# Patient Record
Sex: Female | Born: 1950
Health system: Southern US, Community
[De-identification: ages and names within clinical notes are randomized; demographics above are authoritative.]

## PROBLEM LIST (undated history)

## (undated) DIAGNOSIS — M1990 Unspecified osteoarthritis, unspecified site: Secondary | ICD-10-CM

## (undated) DIAGNOSIS — T07XXXA Unspecified multiple injuries, initial encounter: Secondary | ICD-10-CM

## (undated) HISTORY — DX: Unspecified multiple injuries, initial encounter: T07.XXXA

## (undated) HISTORY — PX: ABDOMINAL EXPLORATION SURGERY: SHX538

## (undated) HISTORY — PX: KNEE SURGERY: SHX244

## (undated) HISTORY — PX: ABDOMINAL HYSTERECTOMY: SHX81

## (undated) HISTORY — DX: Unspecified osteoarthritis, unspecified site: M19.90

---

## 1999-05-10 ENCOUNTER — Encounter: Admission: RE | Admit: 1999-05-10 | Discharge: 1999-05-10 | Payer: Self-pay | Admitting: Obstetrics and Gynecology

## 1999-05-10 ENCOUNTER — Encounter: Payer: Self-pay | Admitting: Obstetrics and Gynecology

## 2000-09-30 ENCOUNTER — Encounter: Payer: Self-pay | Admitting: Obstetrics and Gynecology

## 2000-09-30 ENCOUNTER — Encounter: Admission: RE | Admit: 2000-09-30 | Discharge: 2000-09-30 | Payer: Self-pay | Admitting: Obstetrics and Gynecology

## 2001-11-16 ENCOUNTER — Encounter: Payer: Self-pay | Admitting: Obstetrics and Gynecology

## 2001-11-16 ENCOUNTER — Encounter: Admission: RE | Admit: 2001-11-16 | Discharge: 2001-11-16 | Payer: Self-pay | Admitting: Obstetrics and Gynecology

## 2003-01-19 ENCOUNTER — Encounter: Payer: Self-pay | Admitting: Obstetrics and Gynecology

## 2003-01-19 ENCOUNTER — Ambulatory Visit (HOSPITAL_COMMUNITY): Admission: RE | Admit: 2003-01-19 | Discharge: 2003-01-19 | Payer: Self-pay | Admitting: Obstetrics and Gynecology

## 2005-12-31 ENCOUNTER — Ambulatory Visit (HOSPITAL_COMMUNITY): Admission: RE | Admit: 2005-12-31 | Discharge: 2005-12-31 | Payer: Self-pay | Admitting: Obstetrics and Gynecology

## 2010-06-09 ENCOUNTER — Encounter: Payer: Self-pay | Admitting: Obstetrics and Gynecology

## 2014-12-11 DIAGNOSIS — K210 Gastro-esophageal reflux disease with esophagitis: Secondary | ICD-10-CM | POA: Diagnosis not present

## 2014-12-11 DIAGNOSIS — I10 Essential (primary) hypertension: Secondary | ICD-10-CM | POA: Diagnosis not present

## 2014-12-11 DIAGNOSIS — K219 Gastro-esophageal reflux disease without esophagitis: Secondary | ICD-10-CM | POA: Diagnosis not present

## 2014-12-11 DIAGNOSIS — E785 Hyperlipidemia, unspecified: Secondary | ICD-10-CM | POA: Diagnosis not present

## 2014-12-11 DIAGNOSIS — R202 Paresthesia of skin: Secondary | ICD-10-CM | POA: Diagnosis not present

## 2014-12-14 ENCOUNTER — Other Ambulatory Visit (HOSPITAL_COMMUNITY): Payer: Self-pay | Admitting: Internal Medicine

## 2014-12-14 DIAGNOSIS — Z1231 Encounter for screening mammogram for malignant neoplasm of breast: Secondary | ICD-10-CM

## 2014-12-14 DIAGNOSIS — M8580 Other specified disorders of bone density and structure, unspecified site: Secondary | ICD-10-CM

## 2014-12-20 ENCOUNTER — Ambulatory Visit (HOSPITAL_COMMUNITY)
Admission: RE | Admit: 2014-12-20 | Discharge: 2014-12-20 | Disposition: A | Discharge status: Home / Self Care | Payer: Medicare Other | Source: Ambulatory Visit | Attending: Internal Medicine | Admitting: Internal Medicine

## 2014-12-20 DIAGNOSIS — M8588 Other specified disorders of bone density and structure, other site: Secondary | ICD-10-CM | POA: Diagnosis not present

## 2014-12-20 DIAGNOSIS — M810 Age-related osteoporosis without current pathological fracture: Secondary | ICD-10-CM | POA: Diagnosis not present

## 2014-12-20 DIAGNOSIS — M8580 Other specified disorders of bone density and structure, unspecified site: Secondary | ICD-10-CM | POA: Diagnosis not present

## 2014-12-20 DIAGNOSIS — Z1231 Encounter for screening mammogram for malignant neoplasm of breast: Secondary | ICD-10-CM | POA: Insufficient documentation

## 2014-12-20 DIAGNOSIS — M8589 Other specified disorders of bone density and structure, multiple sites: Secondary | ICD-10-CM | POA: Diagnosis not present

## 2014-12-20 DIAGNOSIS — Z780 Asymptomatic menopausal state: Secondary | ICD-10-CM | POA: Diagnosis not present

## 2014-12-27 DIAGNOSIS — E785 Hyperlipidemia, unspecified: Secondary | ICD-10-CM | POA: Diagnosis not present

## 2014-12-27 DIAGNOSIS — I10 Essential (primary) hypertension: Secondary | ICD-10-CM | POA: Diagnosis not present

## 2015-04-26 DIAGNOSIS — Z13220 Encounter for screening for lipoid disorders: Secondary | ICD-10-CM | POA: Diagnosis not present

## 2015-04-26 DIAGNOSIS — Z0000 Encounter for general adult medical examination without abnormal findings: Secondary | ICD-10-CM | POA: Diagnosis not present

## 2015-04-26 DIAGNOSIS — Z131 Encounter for screening for diabetes mellitus: Secondary | ICD-10-CM | POA: Diagnosis not present

## 2015-04-26 DIAGNOSIS — R7301 Impaired fasting glucose: Secondary | ICD-10-CM | POA: Diagnosis not present

## 2015-04-26 DIAGNOSIS — E782 Mixed hyperlipidemia: Secondary | ICD-10-CM | POA: Diagnosis not present

## 2015-05-01 DIAGNOSIS — E782 Mixed hyperlipidemia: Secondary | ICD-10-CM | POA: Diagnosis not present

## 2015-05-01 DIAGNOSIS — B353 Tinea pedis: Secondary | ICD-10-CM | POA: Diagnosis not present

## 2015-05-01 DIAGNOSIS — R030 Elevated blood-pressure reading, without diagnosis of hypertension: Secondary | ICD-10-CM | POA: Diagnosis not present

## 2015-09-10 DIAGNOSIS — R7301 Impaired fasting glucose: Secondary | ICD-10-CM | POA: Diagnosis not present

## 2015-09-10 DIAGNOSIS — E782 Mixed hyperlipidemia: Secondary | ICD-10-CM | POA: Diagnosis not present

## 2015-09-10 DIAGNOSIS — Z13818 Encounter for screening for other digestive system disorders: Secondary | ICD-10-CM | POA: Diagnosis not present

## 2015-09-12 DIAGNOSIS — R1011 Right upper quadrant pain: Secondary | ICD-10-CM | POA: Diagnosis not present

## 2015-09-12 DIAGNOSIS — I10 Essential (primary) hypertension: Secondary | ICD-10-CM | POA: Diagnosis not present

## 2015-09-12 DIAGNOSIS — E782 Mixed hyperlipidemia: Secondary | ICD-10-CM | POA: Diagnosis not present

## 2015-09-14 ENCOUNTER — Other Ambulatory Visit (HOSPITAL_COMMUNITY): Payer: Self-pay | Admitting: Internal Medicine

## 2015-09-14 DIAGNOSIS — R1011 Right upper quadrant pain: Secondary | ICD-10-CM

## 2015-09-18 ENCOUNTER — Ambulatory Visit (HOSPITAL_COMMUNITY)
Admission: RE | Admit: 2015-09-18 | Discharge: 2015-09-18 | Disposition: A | Discharge status: Home / Self Care | Payer: Medicare Other | Source: Ambulatory Visit | Attending: Internal Medicine | Admitting: Internal Medicine

## 2015-09-18 DIAGNOSIS — R1011 Right upper quadrant pain: Secondary | ICD-10-CM | POA: Diagnosis not present

## 2015-09-18 DIAGNOSIS — R932 Abnormal findings on diagnostic imaging of liver and biliary tract: Secondary | ICD-10-CM | POA: Diagnosis not present

## 2015-09-18 DIAGNOSIS — R109 Unspecified abdominal pain: Secondary | ICD-10-CM | POA: Diagnosis not present

## 2015-09-28 DIAGNOSIS — M79671 Pain in right foot: Secondary | ICD-10-CM | POA: Diagnosis not present

## 2015-11-16 DIAGNOSIS — J060 Acute laryngopharyngitis: Secondary | ICD-10-CM | POA: Diagnosis not present

## 2015-11-16 DIAGNOSIS — R05 Cough: Secondary | ICD-10-CM | POA: Diagnosis not present

## 2016-02-12 DIAGNOSIS — M79674 Pain in right toe(s): Secondary | ICD-10-CM | POA: Diagnosis not present

## 2016-02-12 DIAGNOSIS — M79671 Pain in right foot: Secondary | ICD-10-CM | POA: Diagnosis not present

## 2016-02-12 DIAGNOSIS — L600 Ingrowing nail: Secondary | ICD-10-CM | POA: Diagnosis not present

## 2016-02-12 DIAGNOSIS — L03031 Cellulitis of right toe: Secondary | ICD-10-CM | POA: Diagnosis not present

## 2016-02-14 DIAGNOSIS — E782 Mixed hyperlipidemia: Secondary | ICD-10-CM | POA: Diagnosis not present

## 2016-02-14 DIAGNOSIS — I10 Essential (primary) hypertension: Secondary | ICD-10-CM | POA: Diagnosis not present

## 2016-02-18 DIAGNOSIS — Z0000 Encounter for general adult medical examination without abnormal findings: Secondary | ICD-10-CM | POA: Diagnosis not present

## 2016-02-18 DIAGNOSIS — I10 Essential (primary) hypertension: Secondary | ICD-10-CM | POA: Diagnosis not present

## 2016-02-18 DIAGNOSIS — R05 Cough: Secondary | ICD-10-CM | POA: Diagnosis not present

## 2016-02-18 DIAGNOSIS — E782 Mixed hyperlipidemia: Secondary | ICD-10-CM | POA: Diagnosis not present

## 2016-02-19 ENCOUNTER — Other Ambulatory Visit (HOSPITAL_COMMUNITY): Payer: Self-pay | Admitting: Adult Health Nurse Practitioner

## 2016-02-19 ENCOUNTER — Ambulatory Visit (HOSPITAL_COMMUNITY)
Admission: RE | Admit: 2016-02-19 | Discharge: 2016-02-19 | Disposition: A | Discharge status: Home / Self Care | Payer: Medicare Other | Source: Ambulatory Visit | Attending: Adult Health Nurse Practitioner | Admitting: Adult Health Nurse Practitioner

## 2016-02-19 DIAGNOSIS — R05 Cough: Secondary | ICD-10-CM | POA: Diagnosis not present

## 2016-02-19 DIAGNOSIS — R059 Cough, unspecified: Secondary | ICD-10-CM

## 2016-02-19 DIAGNOSIS — Z87891 Personal history of nicotine dependence: Secondary | ICD-10-CM | POA: Diagnosis not present

## 2016-02-19 DIAGNOSIS — Z1231 Encounter for screening mammogram for malignant neoplasm of breast: Secondary | ICD-10-CM

## 2016-02-27 ENCOUNTER — Ambulatory Visit (HOSPITAL_COMMUNITY)
Admission: RE | Admit: 2016-02-27 | Discharge: 2016-02-27 | Disposition: A | Discharge status: Home / Self Care | Payer: Medicare Other | Source: Ambulatory Visit | Attending: Adult Health Nurse Practitioner | Admitting: Adult Health Nurse Practitioner

## 2016-02-27 DIAGNOSIS — Z1231 Encounter for screening mammogram for malignant neoplasm of breast: Secondary | ICD-10-CM

## 2016-03-04 DIAGNOSIS — L03031 Cellulitis of right toe: Secondary | ICD-10-CM | POA: Diagnosis not present

## 2016-03-04 DIAGNOSIS — L600 Ingrowing nail: Secondary | ICD-10-CM | POA: Diagnosis not present

## 2016-03-04 DIAGNOSIS — M79671 Pain in right foot: Secondary | ICD-10-CM | POA: Diagnosis not present

## 2016-03-04 DIAGNOSIS — M79674 Pain in right toe(s): Secondary | ICD-10-CM | POA: Diagnosis not present

## 2016-07-28 ENCOUNTER — Ambulatory Visit (HOSPITAL_COMMUNITY)
Admission: RE | Admit: 2016-07-28 | Discharge: 2016-07-28 | Disposition: A | Discharge status: Home / Self Care | Payer: Medicare Other | Source: Ambulatory Visit | Attending: Internal Medicine | Admitting: Internal Medicine

## 2016-07-28 ENCOUNTER — Other Ambulatory Visit (HOSPITAL_COMMUNITY): Payer: Self-pay | Admitting: Internal Medicine

## 2016-07-28 DIAGNOSIS — S52692A Other fracture of lower end of left ulna, initial encounter for closed fracture: Secondary | ICD-10-CM | POA: Diagnosis not present

## 2016-07-28 DIAGNOSIS — X58XXXA Exposure to other specified factors, initial encounter: Secondary | ICD-10-CM | POA: Diagnosis not present

## 2016-07-28 DIAGNOSIS — M25522 Pain in left elbow: Secondary | ICD-10-CM | POA: Diagnosis not present

## 2016-07-28 DIAGNOSIS — M25539 Pain in unspecified wrist: Secondary | ICD-10-CM | POA: Insufficient documentation

## 2016-07-28 DIAGNOSIS — S59902A Unspecified injury of left elbow, initial encounter: Secondary | ICD-10-CM | POA: Diagnosis not present

## 2016-07-28 DIAGNOSIS — W19XXXA Unspecified fall, initial encounter: Secondary | ICD-10-CM

## 2016-07-28 DIAGNOSIS — S52602A Unspecified fracture of lower end of left ulna, initial encounter for closed fracture: Secondary | ICD-10-CM | POA: Diagnosis not present

## 2016-07-29 ENCOUNTER — Ambulatory Visit (INDEPENDENT_AMBULATORY_CARE_PROVIDER_SITE_OTHER): Payer: Medicare Other | Admitting: Orthopedic Surgery

## 2016-07-29 ENCOUNTER — Encounter: Payer: Self-pay | Admitting: Orthopedic Surgery

## 2016-07-29 VITALS — BP 175/96 | HR 86 | Temp 97.7°F | Ht 64.5 in | Wt 126.0 lb

## 2016-07-29 DIAGNOSIS — S52692A Other fracture of lower end of left ulna, initial encounter for closed fracture: Secondary | ICD-10-CM

## 2016-07-29 NOTE — Progress Notes (Signed)
Patient ID: Sydney Mcdowell, female   DOB: [birth year], 66 y.o.   MRN: [id number]  Chief Complaint  Patient presents with  . Wrist Pain    [place] 07/28/16.  Fracture of distal ulnar.    HPI Sydney Mcdowell is a 66 y.o. female.   Presents for evaluation of a left wrist fracture. Referring physician Dr. Nevada Crane  The patient fell on March 10 presented to the primary care physician and x-rays showed a distal ulnar fracture nondisplaced he referred her over today  She complains of pain in her left arm including her left elbow shoulder primarily left wrist with swelling decreased range of motion and dull throbbing pain  She brought herself a splint which is not working too good at this time   Review of Systems Review of Systems  Skin: Positive for color change.  Neurological: Negative for numbness.     Past Medical History:  Diagnosis Date  . Arthritis   . Fractures     Past Surgical History:  Procedure Laterality Date  . ABDOMINAL EXPLORATION SURGERY    . ABDOMINAL HYSTERECTOMY    . KNEE SURGERY        Physical Exam 1 Blood pressure (!) 175/96, pulse 86, temperature 97.7 F (36.5 C), height 5' 4.5" (1.638 m), weight 126 lb (57.2 kg). Physical Exam 2 The patient is well developed well nourished and well groomed. 3 Orientation to person place and time is normal  4 Mood is pleasant.  5 Ambulatory status No gait disturbances 6 Inspection of the left wrist reveals moderate tenderness   mild swelling over the left distal ulnar no deformity 7 Range of motion assessment: The range of motion is diminished primarily secondary to pain 8 Stability tests are deferred because of pain but the x-ray shows no subluxation of the joint 9 Strength assessment muscle tone is normal resistance testing is deferred because of pain and swelling  10 Nerve function normal radial ulnar median nerve sensation 11 Vascular function pulse and perfusion normal radial ulnar artery 12 Local lymphatic system  normal in the epitrochlear region  She has mild tenderness over the elbow, x-ray was negative  Mild tenderness shoulder normal range of motion  Opposite extremity right upper extremity there is no alignment abnormality, no contracture, no subluxation, no atrophy and neurovascular exam is intact  Data Reviewed X-rays I've independently interpreted the x-ray as follows  3 views of the left wrist: And left elbow nondisplaced oblique distal ulnar fracture with normal radial head normal elbow  Assessment    Distal ulna fracture   Long splint removable    Plan    X-rays 6 weeks

## 2016-08-18 DIAGNOSIS — I10 Essential (primary) hypertension: Secondary | ICD-10-CM | POA: Diagnosis not present

## 2016-08-18 DIAGNOSIS — E782 Mixed hyperlipidemia: Secondary | ICD-10-CM | POA: Diagnosis not present

## 2016-08-20 DIAGNOSIS — I10 Essential (primary) hypertension: Secondary | ICD-10-CM | POA: Diagnosis not present

## 2016-08-20 DIAGNOSIS — S6292XD Unspecified fracture of left wrist and hand, subsequent encounter for fracture with routine healing: Secondary | ICD-10-CM | POA: Diagnosis not present

## 2016-08-20 DIAGNOSIS — E782 Mixed hyperlipidemia: Secondary | ICD-10-CM | POA: Diagnosis not present

## 2016-08-20 DIAGNOSIS — Z6821 Body mass index (BMI) 21.0-21.9, adult: Secondary | ICD-10-CM | POA: Diagnosis not present

## 2016-09-08 ENCOUNTER — Encounter: Payer: Self-pay | Admitting: Orthopedic Surgery

## 2016-09-08 ENCOUNTER — Ambulatory Visit (INDEPENDENT_AMBULATORY_CARE_PROVIDER_SITE_OTHER): Payer: Self-pay | Admitting: Orthopedic Surgery

## 2016-09-08 ENCOUNTER — Ambulatory Visit (INDEPENDENT_AMBULATORY_CARE_PROVIDER_SITE_OTHER): Payer: Medicare Other

## 2016-09-08 DIAGNOSIS — S52692G Other fracture of lower end of left ulna, subsequent encounter for closed fracture with delayed healing: Secondary | ICD-10-CM | POA: Diagnosis not present

## 2016-09-08 NOTE — Progress Notes (Signed)
Follow-up fracture distal ulna  X-rays out of plaster today  X-rays show the fracture is healing with no malalignment. Minimal callus formation  She is still tender at the fracture site. She does complain of some pain at the elbow but there is no subluxation dislocation in range of motion remains normal  Recommend x-ray in 6 weeks and continue immobilization until then

## 2016-10-20 ENCOUNTER — Ambulatory Visit (INDEPENDENT_AMBULATORY_CARE_PROVIDER_SITE_OTHER): Payer: Medicare Other

## 2016-10-20 ENCOUNTER — Ambulatory Visit (INDEPENDENT_AMBULATORY_CARE_PROVIDER_SITE_OTHER): Payer: Self-pay | Admitting: Orthopedic Surgery

## 2016-10-20 ENCOUNTER — Encounter: Payer: Self-pay | Admitting: Orthopedic Surgery

## 2016-10-20 DIAGNOSIS — S52692G Other fracture of lower end of left ulna, subsequent encounter for closed fracture with delayed healing: Secondary | ICD-10-CM | POA: Diagnosis not present

## 2016-10-20 MED ORDER — IBUPROFEN 800 MG PO TABS: 800.0000 mg | ORAL_TABLET | Freq: Three times a day (TID) | ORAL | 1 refills | Status: DC | PRN

## 2016-10-20 NOTE — Progress Notes (Signed)
Fracture care follow-up  Chief Complaint  Patient presents with  . Follow-up    xray Lt wrist fx, DOI 07/28/16    Post injury day number  69, week # 10  Fracture treated with  cast followed by removable brace  X-rays today show healing with normal alignment in the left ulna   Current Outpatient Prescriptions:  .  aspirin 325 MG tablet, Take 325 mg by mouth 2 (two) times daily., Disp: , Rfl:  .  Coenzyme Q10 (COQ-10) 10 MG CAPS, Take by mouth., Disp: , Rfl:  .  SIMVASTATIN PO, Take by mouth., Disp: , Rfl:    Clinical exam  overall alignment looks good between the right and the left forearm and wrist. She does have some tenderness near the fracture site  Encounter Diagnosis  Name Primary?  . Other closed fracture of distal end of left ulna with delayed healing, subsequent encounter Yes    Plan Recommend self weaning process from the forearm splint and the ibuprofen is okay  Follow-up as needed  Meds ordered this encounter  Medications  . ibuprofen (ADVIL,MOTRIN) 800 MG tablet    Sig: Take 1 tablet (800 mg total) by mouth every 8 (eight) hours as needed.    Dispense:  90 tablet    Refill:  1

## 2016-10-20 NOTE — Patient Instructions (Signed)
Use the brace as needed

## 2017-02-05 ENCOUNTER — Other Ambulatory Visit: Payer: Self-pay

## 2017-02-05 NOTE — Patient Outreach (Signed)
[facility]) Care Management  02/05/2017  Sydney Mcdowell 06/03/1950 [id number]   Medication Adherence call to [name] the reason for this call is because [name] is showing past due under [place] health Care Ins.on Simvastatin 20 mg she said she is going to get it from [employer] today she just notice she did not have any medication I ask if she had a pill box she said she likes to use the bottle instead of a pill box, offer to order it from [place] she refuse she said she was going to order it her self.    [place] Management Direct Dial [phone]  Fax [phone] [email or web address]

## 2017-02-18 ENCOUNTER — Other Ambulatory Visit (HOSPITAL_COMMUNITY): Payer: Self-pay | Admitting: Internal Medicine

## 2017-02-18 DIAGNOSIS — Z1231 Encounter for screening mammogram for malignant neoplasm of breast: Secondary | ICD-10-CM

## 2017-02-20 DIAGNOSIS — I10 Essential (primary) hypertension: Secondary | ICD-10-CM | POA: Diagnosis not present

## 2017-02-24 DIAGNOSIS — I10 Essential (primary) hypertension: Secondary | ICD-10-CM | POA: Diagnosis not present

## 2017-02-24 DIAGNOSIS — Z0000 Encounter for general adult medical examination without abnormal findings: Secondary | ICD-10-CM | POA: Diagnosis not present

## 2017-02-24 DIAGNOSIS — Z6820 Body mass index (BMI) 20.0-20.9, adult: Secondary | ICD-10-CM | POA: Diagnosis not present

## 2017-02-24 DIAGNOSIS — E782 Mixed hyperlipidemia: Secondary | ICD-10-CM | POA: Diagnosis not present

## 2017-04-30 DIAGNOSIS — J029 Acute pharyngitis, unspecified: Secondary | ICD-10-CM | POA: Diagnosis not present

## 2017-04-30 DIAGNOSIS — R05 Cough: Secondary | ICD-10-CM | POA: Diagnosis not present

## 2017-04-30 DIAGNOSIS — H6501 Acute serous otitis media, right ear: Secondary | ICD-10-CM | POA: Diagnosis not present

## 2017-05-15 DIAGNOSIS — H6501 Acute serous otitis media, right ear: Secondary | ICD-10-CM | POA: Diagnosis not present

## 2017-05-15 DIAGNOSIS — R05 Cough: Secondary | ICD-10-CM | POA: Diagnosis not present

## 2017-05-15 DIAGNOSIS — J019 Acute sinusitis, unspecified: Secondary | ICD-10-CM | POA: Diagnosis not present

## 2017-07-27 DIAGNOSIS — H524 Presbyopia: Secondary | ICD-10-CM | POA: Diagnosis not present

## 2017-07-27 DIAGNOSIS — H353132 Nonexudative age-related macular degeneration, bilateral, intermediate dry stage: Secondary | ICD-10-CM | POA: Diagnosis not present

## 2017-08-19 DIAGNOSIS — H6501 Acute serous otitis media, right ear: Secondary | ICD-10-CM | POA: Diagnosis not present

## 2017-08-19 DIAGNOSIS — I10 Essential (primary) hypertension: Secondary | ICD-10-CM | POA: Diagnosis not present

## 2017-08-19 DIAGNOSIS — Z8781 Personal history of (healed) traumatic fracture: Secondary | ICD-10-CM | POA: Diagnosis not present

## 2017-08-19 DIAGNOSIS — Z7689 Persons encountering health services in other specified circumstances: Secondary | ICD-10-CM | POA: Diagnosis not present

## 2017-08-19 DIAGNOSIS — J029 Acute pharyngitis, unspecified: Secondary | ICD-10-CM | POA: Diagnosis not present

## 2017-08-19 DIAGNOSIS — J019 Acute sinusitis, unspecified: Secondary | ICD-10-CM | POA: Diagnosis not present

## 2017-08-19 DIAGNOSIS — R05 Cough: Secondary | ICD-10-CM | POA: Diagnosis not present

## 2017-08-25 DIAGNOSIS — E782 Mixed hyperlipidemia: Secondary | ICD-10-CM | POA: Diagnosis not present

## 2017-08-25 DIAGNOSIS — I10 Essential (primary) hypertension: Secondary | ICD-10-CM | POA: Diagnosis not present

## 2017-08-25 DIAGNOSIS — J019 Acute sinusitis, unspecified: Secondary | ICD-10-CM | POA: Diagnosis not present

## 2017-09-25 DIAGNOSIS — H9209 Otalgia, unspecified ear: Secondary | ICD-10-CM | POA: Diagnosis not present

## 2017-09-25 DIAGNOSIS — I10 Essential (primary) hypertension: Secondary | ICD-10-CM | POA: Diagnosis not present

## 2017-11-10 ENCOUNTER — Other Ambulatory Visit: Payer: Self-pay | Admitting: Orthopedic Surgery

## 2017-11-10 DIAGNOSIS — S52692G Other fracture of lower end of left ulna, subsequent encounter for closed fracture with delayed healing: Secondary | ICD-10-CM

## 2018-02-25 DIAGNOSIS — I10 Essential (primary) hypertension: Secondary | ICD-10-CM | POA: Diagnosis not present

## 2018-02-25 DIAGNOSIS — E559 Vitamin D deficiency, unspecified: Secondary | ICD-10-CM | POA: Diagnosis not present

## 2018-02-25 DIAGNOSIS — E782 Mixed hyperlipidemia: Secondary | ICD-10-CM | POA: Diagnosis not present

## 2018-03-30 DIAGNOSIS — Z0000 Encounter for general adult medical examination without abnormal findings: Secondary | ICD-10-CM | POA: Diagnosis not present

## 2018-03-30 DIAGNOSIS — E559 Vitamin D deficiency, unspecified: Secondary | ICD-10-CM | POA: Diagnosis not present

## 2018-03-30 DIAGNOSIS — I10 Essential (primary) hypertension: Secondary | ICD-10-CM | POA: Diagnosis not present

## 2018-03-30 DIAGNOSIS — E782 Mixed hyperlipidemia: Secondary | ICD-10-CM | POA: Diagnosis not present

## 2018-04-06 ENCOUNTER — Other Ambulatory Visit (HOSPITAL_COMMUNITY): Payer: Self-pay | Admitting: Internal Medicine

## 2018-04-06 DIAGNOSIS — Z1231 Encounter for screening mammogram for malignant neoplasm of breast: Secondary | ICD-10-CM

## 2018-04-22 ENCOUNTER — Encounter (HOSPITAL_COMMUNITY): Payer: Self-pay

## 2018-04-22 ENCOUNTER — Ambulatory Visit (HOSPITAL_COMMUNITY)
Admission: RE | Admit: 2018-04-22 | Discharge: 2018-04-22 | Disposition: A | Discharge status: Home / Self Care | Payer: Medicare Other | Source: Ambulatory Visit | Attending: Internal Medicine | Admitting: Internal Medicine

## 2018-04-22 DIAGNOSIS — Z1231 Encounter for screening mammogram for malignant neoplasm of breast: Secondary | ICD-10-CM | POA: Insufficient documentation

## 2018-05-02 DIAGNOSIS — Z1212 Encounter for screening for malignant neoplasm of rectum: Secondary | ICD-10-CM | POA: Diagnosis not present

## 2018-05-02 DIAGNOSIS — Z1211 Encounter for screening for malignant neoplasm of colon: Secondary | ICD-10-CM | POA: Diagnosis not present

## 2018-07-13 IMAGING — DX DG ELBOW COMPLETE 3+V*L*
4 series · 4 of 4 positions shown · non-contrast
Comparison: None.

CLINICAL DATA: Pain following fall

EXAM:
LEFT ELBOW - COMPLETE 3+ VIEW

[elbow ap]
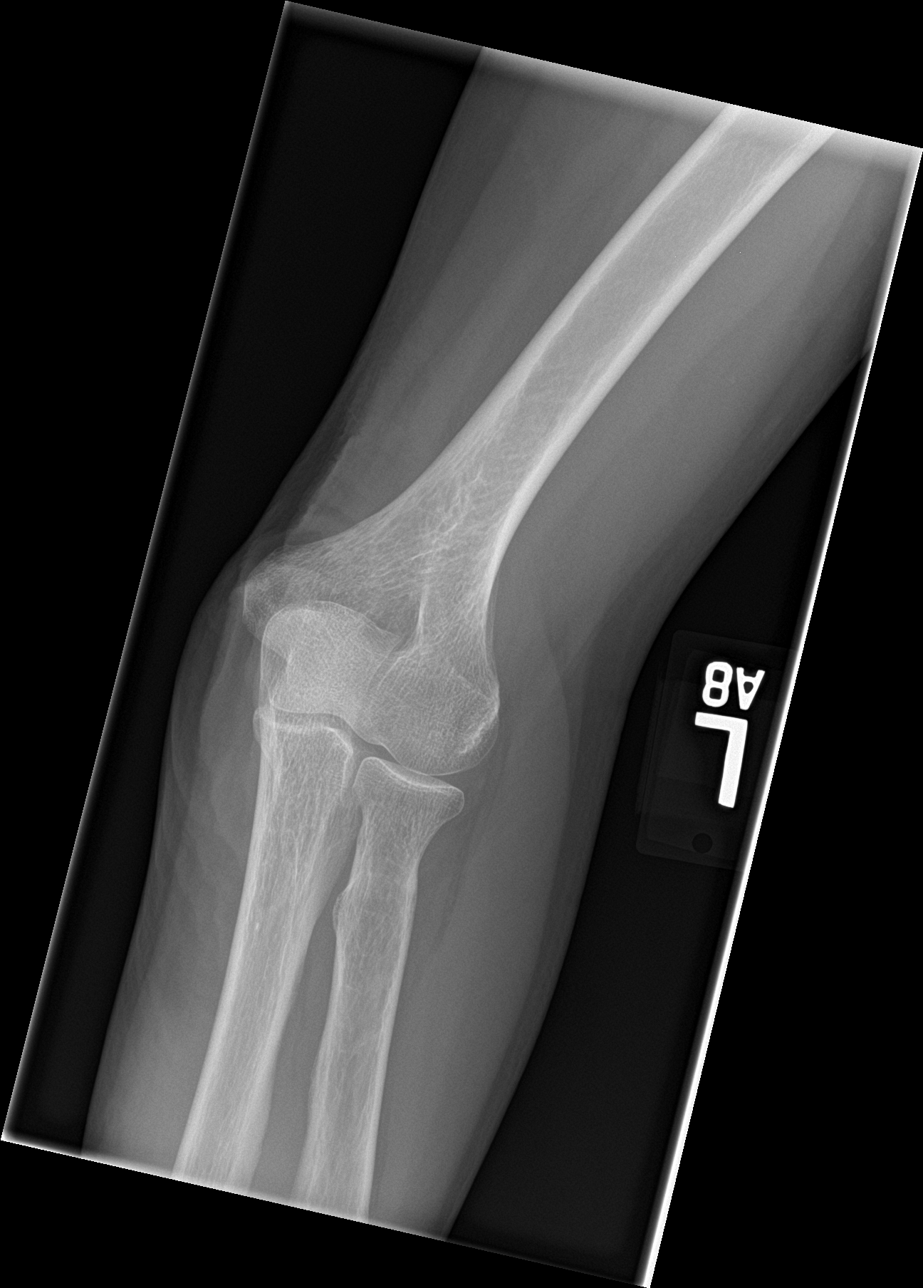

[elbow obl]
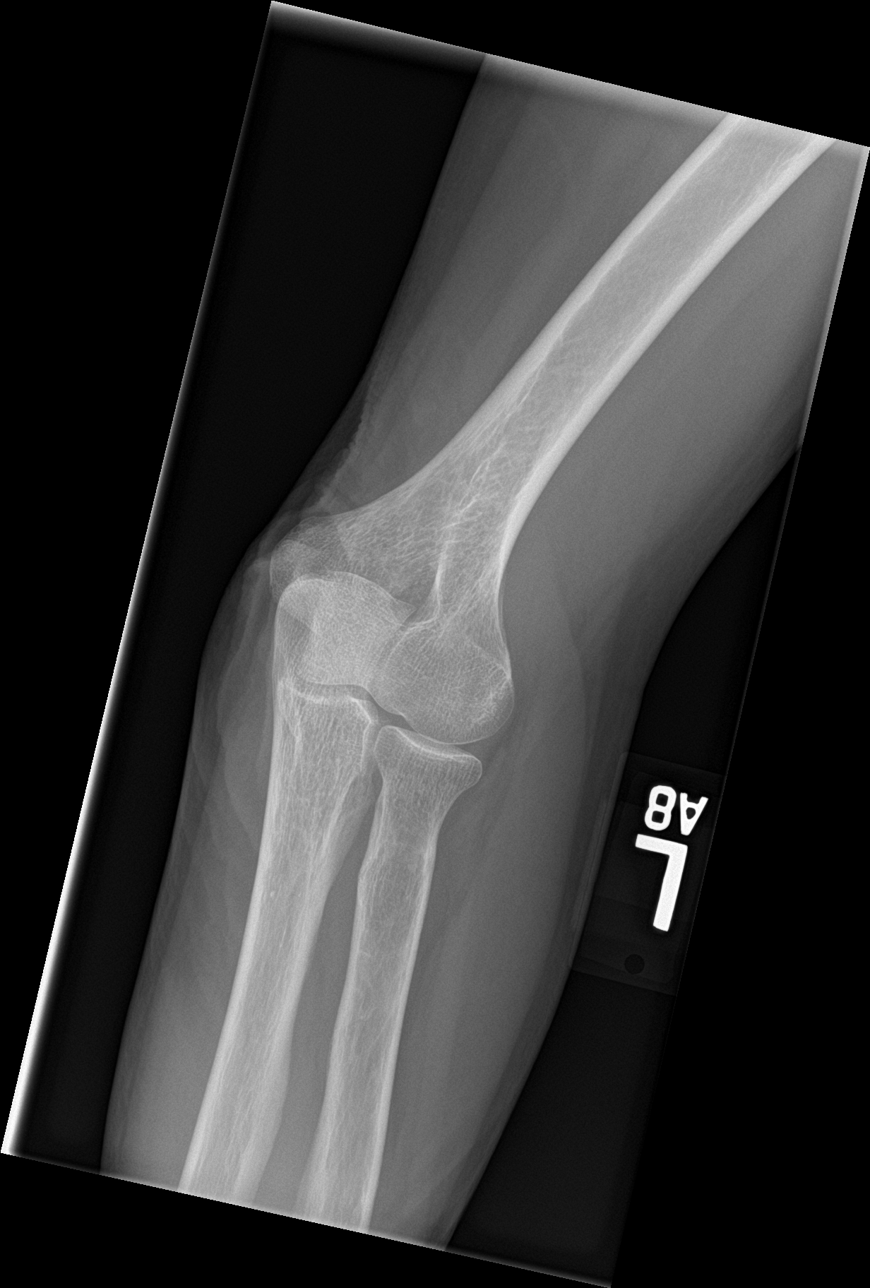

[elbow lat (1 of 2)]
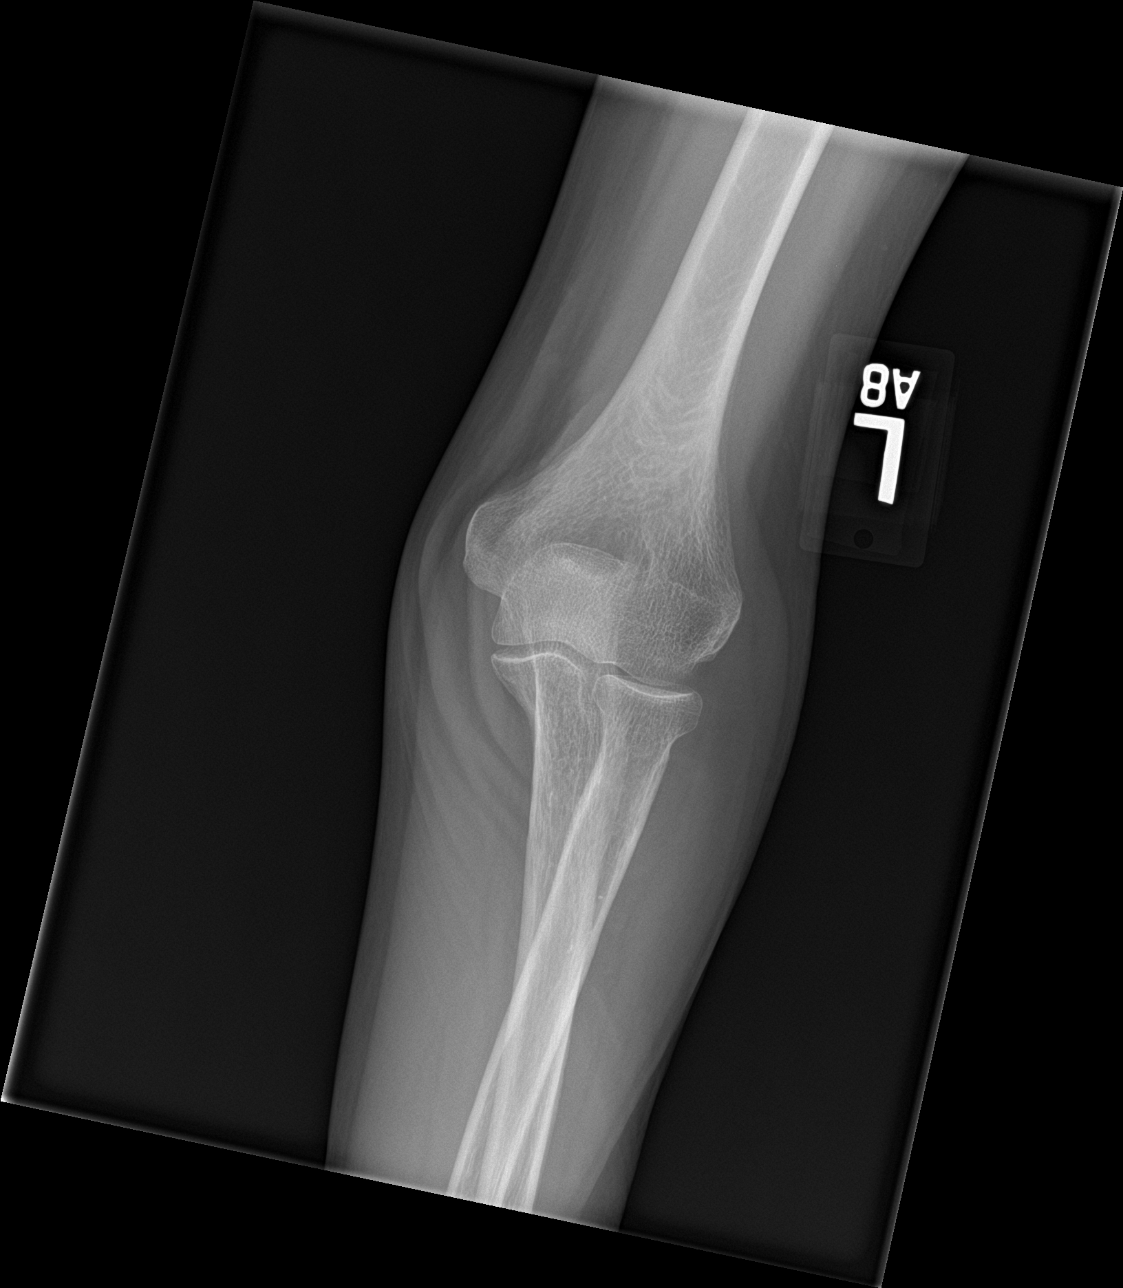

[elbow lat (2 of 2)]
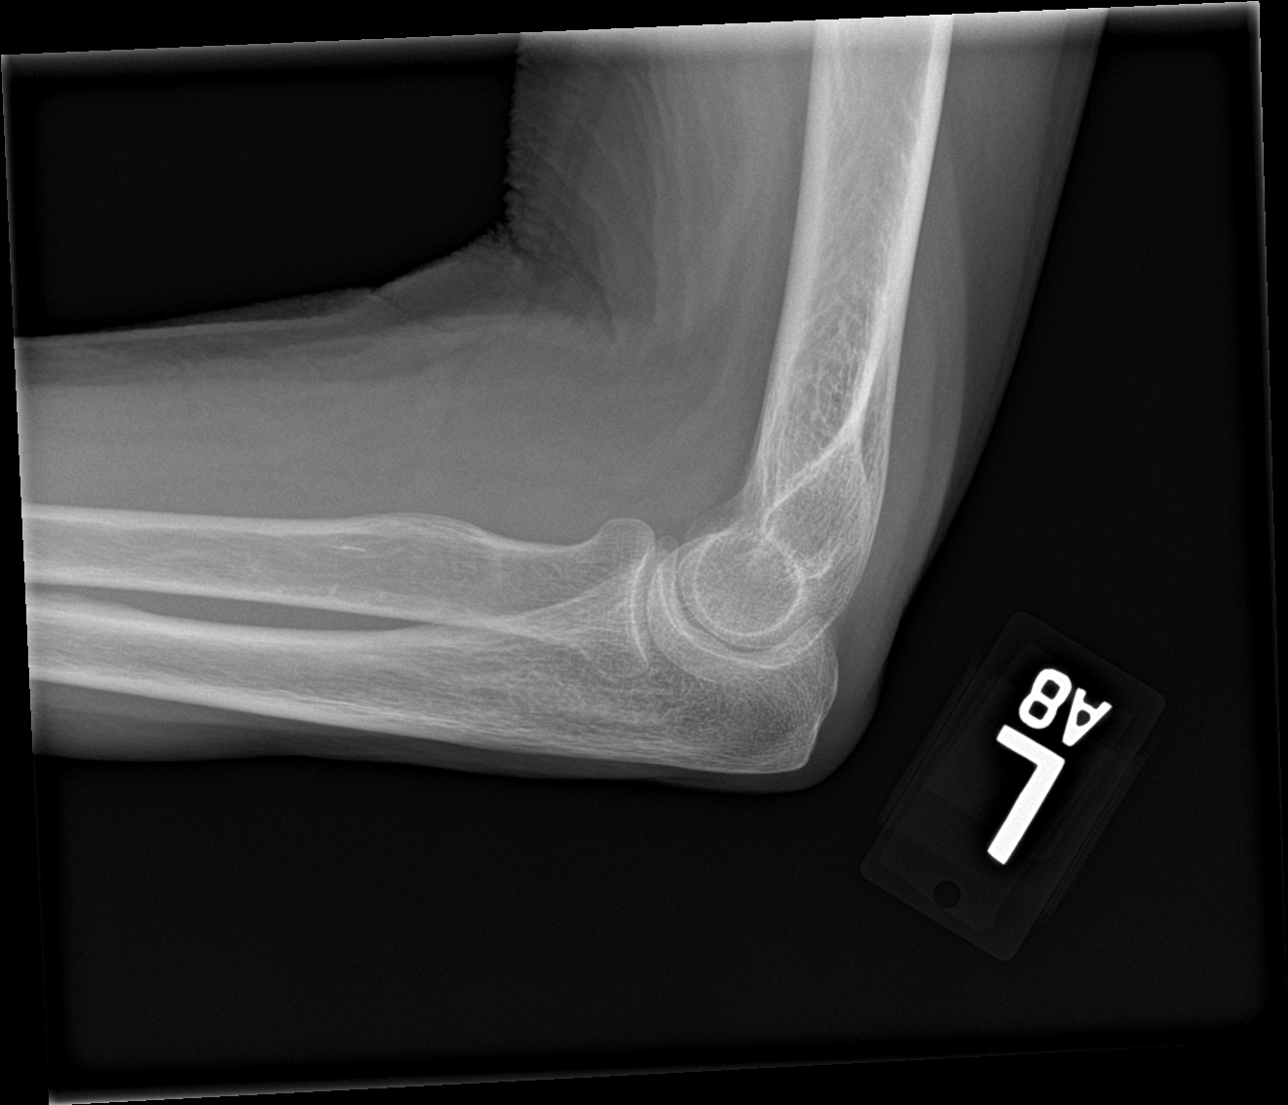

[4 of 4 positions shown; findings below may reference images not displayed]

FINDINGS: Frontal, lateral, and bilateral oblique views were obtained. There
is no evident fracture or dislocation. No joint effusion. The joint
spaces appear normal. No erosive change.
IMPRESSION: No fracture or dislocation.  No evident arthropathic change.

## 2018-07-13 IMAGING — DX DG WRIST COMPLETE 3+V*L*
4 series · 4 of 4 positions shown · non-contrast
Comparison: None.

CLINICAL DATA: Pain following fall

EXAM:
LEFT WRIST - COMPLETE 3+ VIEW

[wrist pa]
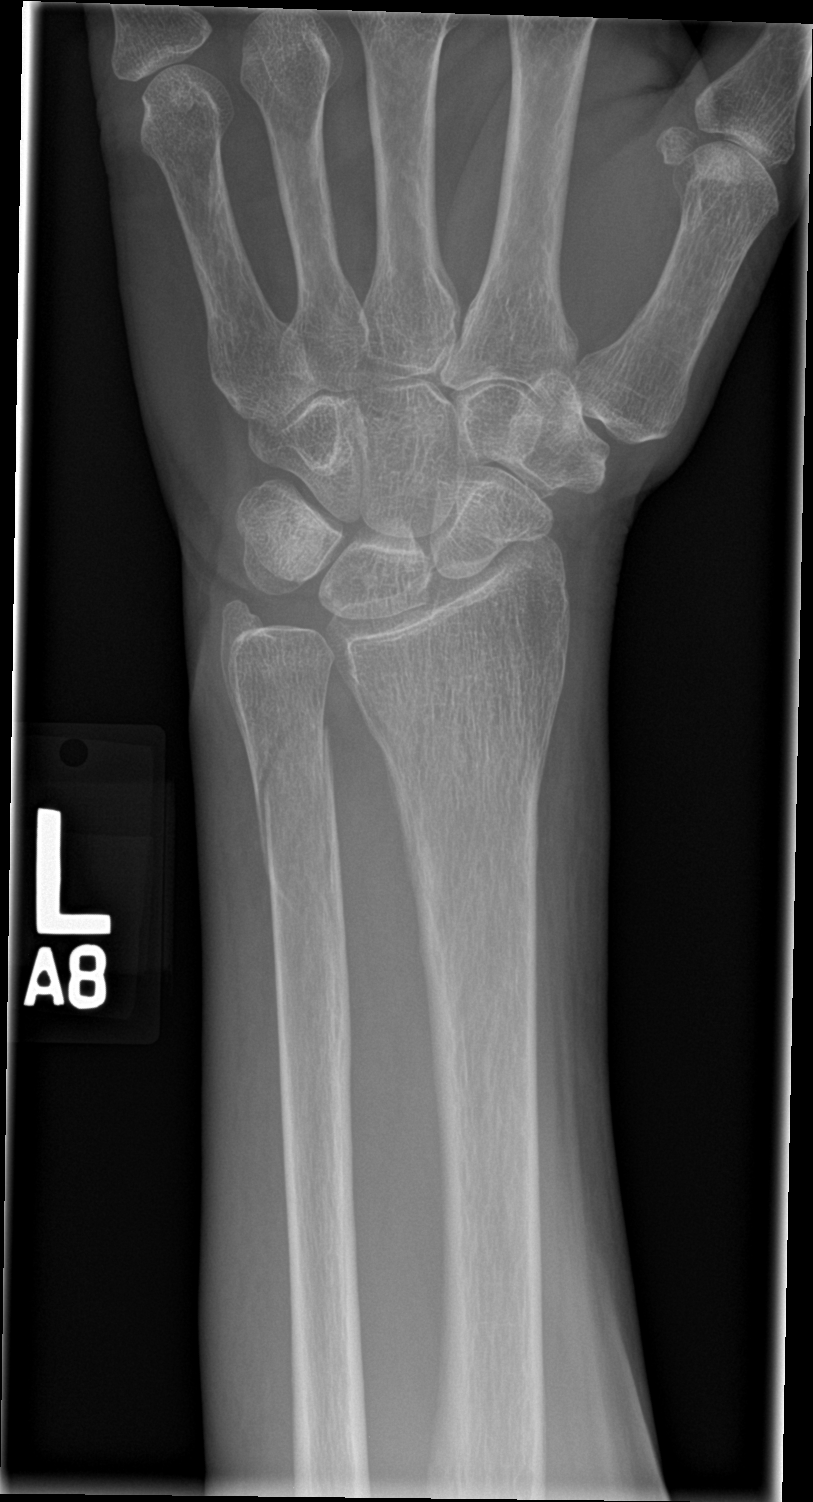

[wrist obl]
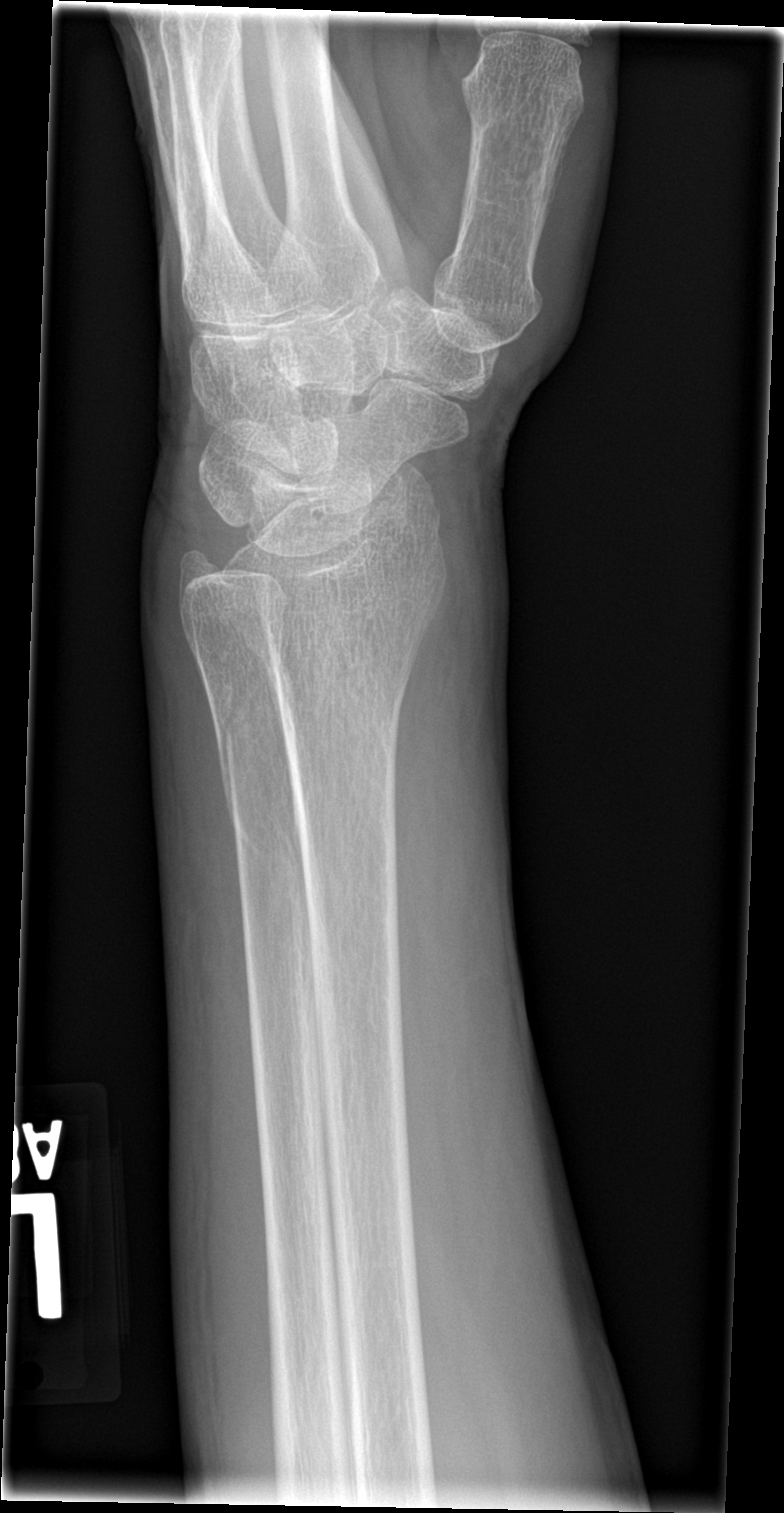

[wrist lat]
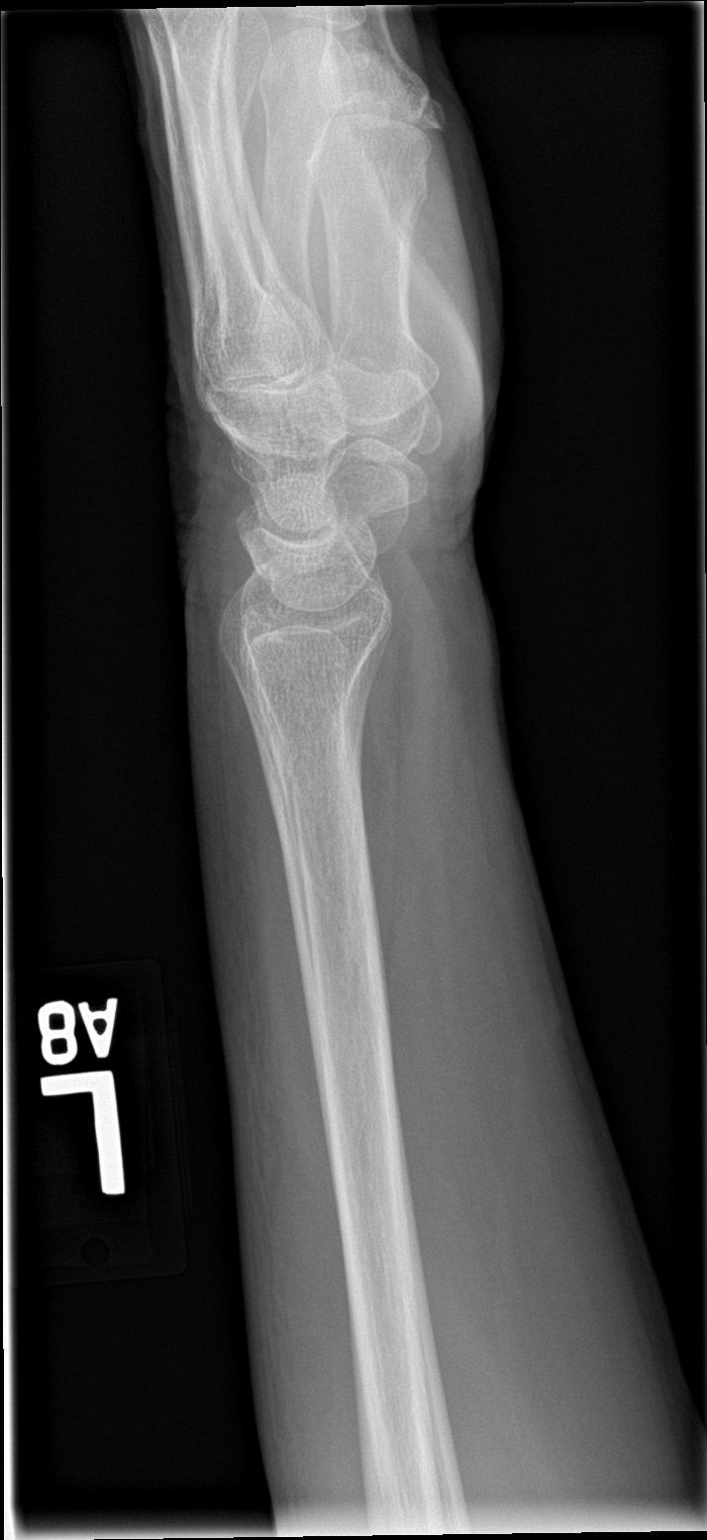

[wrist navicular]
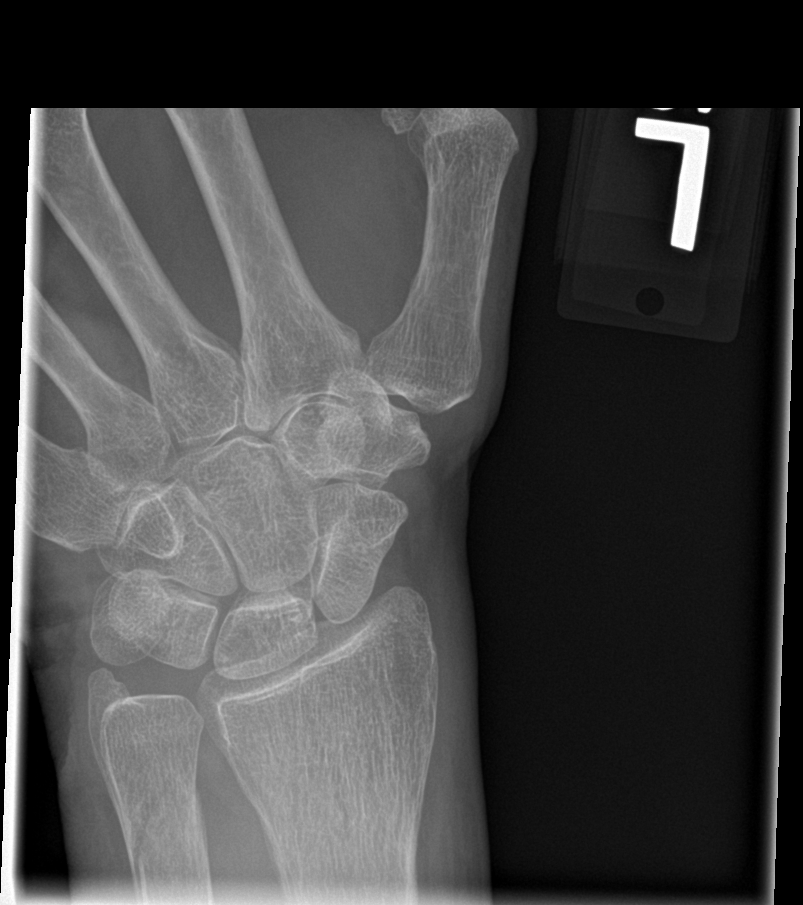

[4 of 4 positions shown; findings below may reference images not displayed]

FINDINGS: Frontal, oblique, lateral, and ulnar deviation scaphoid images were
obtained. There is an obliquely oriented fracture of the distal
ulnar diaphysis with alignment near anatomic in this area. No other
fracture is evident. No dislocation. Joint spaces appear
unremarkable.
IMPRESSION: Fracture distal ulna with alignment near anatomic.

These results will be called to the ordering clinician or
representative by the Radiologist Assistant, and communication
documented in the PACS or zVision Dashboard.

## 2018-09-16 DIAGNOSIS — E782 Mixed hyperlipidemia: Secondary | ICD-10-CM | POA: Diagnosis not present

## 2018-09-16 DIAGNOSIS — E559 Vitamin D deficiency, unspecified: Secondary | ICD-10-CM | POA: Diagnosis not present

## 2018-09-16 DIAGNOSIS — I10 Essential (primary) hypertension: Secondary | ICD-10-CM | POA: Diagnosis not present

## 2018-09-21 DIAGNOSIS — E785 Hyperlipidemia, unspecified: Secondary | ICD-10-CM | POA: Diagnosis not present

## 2018-09-21 DIAGNOSIS — E559 Vitamin D deficiency, unspecified: Secondary | ICD-10-CM | POA: Diagnosis not present

## 2018-09-21 DIAGNOSIS — I10 Essential (primary) hypertension: Secondary | ICD-10-CM | POA: Diagnosis not present

## 2018-09-23 DIAGNOSIS — Z0000 Encounter for general adult medical examination without abnormal findings: Secondary | ICD-10-CM | POA: Diagnosis not present

## 2019-03-29 DIAGNOSIS — E782 Mixed hyperlipidemia: Secondary | ICD-10-CM | POA: Diagnosis not present

## 2019-03-29 DIAGNOSIS — E785 Hyperlipidemia, unspecified: Secondary | ICD-10-CM | POA: Diagnosis not present

## 2019-03-29 DIAGNOSIS — I10 Essential (primary) hypertension: Secondary | ICD-10-CM | POA: Diagnosis not present

## 2019-03-29 DIAGNOSIS — E559 Vitamin D deficiency, unspecified: Secondary | ICD-10-CM | POA: Diagnosis not present

## 2019-04-01 DIAGNOSIS — M5481 Occipital neuralgia: Secondary | ICD-10-CM | POA: Diagnosis not present

## 2019-04-01 DIAGNOSIS — E559 Vitamin D deficiency, unspecified: Secondary | ICD-10-CM | POA: Diagnosis not present

## 2019-04-01 DIAGNOSIS — Z23 Encounter for immunization: Secondary | ICD-10-CM | POA: Diagnosis not present

## 2019-04-01 DIAGNOSIS — I10 Essential (primary) hypertension: Secondary | ICD-10-CM | POA: Diagnosis not present

## 2019-04-01 DIAGNOSIS — E782 Mixed hyperlipidemia: Secondary | ICD-10-CM | POA: Diagnosis not present

## 2019-04-25 ENCOUNTER — Other Ambulatory Visit: Payer: Self-pay

## 2019-04-25 NOTE — Patient Outreach (Signed)
[facility]) Care Management  04/25/2019  Sydney Mcdowell 01-04-1951 [id number]   Medication Adherence call to Mrs. Sydney Mcdowell [facility] Identifiers Verify spoke with patient she is past due on Lisinopril 10 mg she takes 1 tablet daily,patient explain she has medication at this time and will order when due,patient explain someone from [facility] had already call and had call doctors office to make sure patient had enough medication. [name] is showing past due under [place].   [place] Management Direct Dial [phone]  Fax [phone] [email or web address]

## 2019-05-02 DIAGNOSIS — E785 Hyperlipidemia, unspecified: Secondary | ICD-10-CM | POA: Diagnosis not present

## 2019-05-02 DIAGNOSIS — I10 Essential (primary) hypertension: Secondary | ICD-10-CM | POA: Diagnosis not present

## 2019-05-27 DIAGNOSIS — H43811 Vitreous degeneration, right eye: Secondary | ICD-10-CM | POA: Diagnosis not present

## 2019-05-27 DIAGNOSIS — H524 Presbyopia: Secondary | ICD-10-CM | POA: Diagnosis not present

## 2019-07-21 DIAGNOSIS — E785 Hyperlipidemia, unspecified: Secondary | ICD-10-CM | POA: Diagnosis not present

## 2019-07-21 DIAGNOSIS — I10 Essential (primary) hypertension: Secondary | ICD-10-CM | POA: Diagnosis not present

## 2019-09-16 DIAGNOSIS — I10 Essential (primary) hypertension: Secondary | ICD-10-CM | POA: Diagnosis not present

## 2019-09-16 DIAGNOSIS — E785 Hyperlipidemia, unspecified: Secondary | ICD-10-CM | POA: Diagnosis not present

## 2019-09-16 DIAGNOSIS — E559 Vitamin D deficiency, unspecified: Secondary | ICD-10-CM | POA: Diagnosis not present

## 2019-10-10 DIAGNOSIS — E782 Mixed hyperlipidemia: Secondary | ICD-10-CM | POA: Diagnosis not present

## 2019-10-10 DIAGNOSIS — E559 Vitamin D deficiency, unspecified: Secondary | ICD-10-CM | POA: Diagnosis not present

## 2019-10-10 DIAGNOSIS — E875 Hyperkalemia: Secondary | ICD-10-CM | POA: Diagnosis not present

## 2019-10-10 DIAGNOSIS — E785 Hyperlipidemia, unspecified: Secondary | ICD-10-CM | POA: Diagnosis not present

## 2019-10-11 DIAGNOSIS — E785 Hyperlipidemia, unspecified: Secondary | ICD-10-CM | POA: Diagnosis not present

## 2019-10-11 DIAGNOSIS — I10 Essential (primary) hypertension: Secondary | ICD-10-CM | POA: Diagnosis not present

## 2019-10-11 DIAGNOSIS — E559 Vitamin D deficiency, unspecified: Secondary | ICD-10-CM | POA: Diagnosis not present

## 2019-10-14 ENCOUNTER — Other Ambulatory Visit (HOSPITAL_COMMUNITY): Payer: Self-pay | Admitting: Internal Medicine

## 2019-10-14 DIAGNOSIS — Z0001 Encounter for general adult medical examination with abnormal findings: Secondary | ICD-10-CM | POA: Diagnosis not present

## 2019-10-14 DIAGNOSIS — E559 Vitamin D deficiency, unspecified: Secondary | ICD-10-CM | POA: Diagnosis not present

## 2019-10-14 DIAGNOSIS — Z1231 Encounter for screening mammogram for malignant neoplasm of breast: Secondary | ICD-10-CM

## 2019-10-14 DIAGNOSIS — E875 Hyperkalemia: Secondary | ICD-10-CM | POA: Diagnosis not present

## 2019-10-14 DIAGNOSIS — I10 Essential (primary) hypertension: Secondary | ICD-10-CM | POA: Diagnosis not present

## 2019-10-14 DIAGNOSIS — E782 Mixed hyperlipidemia: Secondary | ICD-10-CM | POA: Diagnosis not present

## 2019-11-24 ENCOUNTER — Ambulatory Visit (HOSPITAL_COMMUNITY)
Admission: RE | Admit: 2019-11-24 | Discharge: 2019-11-24 | Disposition: A | Discharge status: Home / Self Care | Payer: Medicare Other | Source: Ambulatory Visit | Attending: Internal Medicine | Admitting: Internal Medicine

## 2019-11-24 ENCOUNTER — Other Ambulatory Visit: Payer: Self-pay

## 2019-11-24 DIAGNOSIS — Z1231 Encounter for screening mammogram for malignant neoplasm of breast: Secondary | ICD-10-CM | POA: Diagnosis not present

## 2019-11-25 DIAGNOSIS — H43811 Vitreous degeneration, right eye: Secondary | ICD-10-CM | POA: Diagnosis not present

## 2020-01-04 ENCOUNTER — Other Ambulatory Visit: Payer: Self-pay

## 2020-01-04 ENCOUNTER — Other Ambulatory Visit: Payer: Self-pay | Admitting: Internal Medicine

## 2020-01-04 ENCOUNTER — Ambulatory Visit (HOSPITAL_COMMUNITY)
Admission: RE | Admit: 2020-01-04 | Discharge: 2020-01-04 | Disposition: A | Discharge status: Home / Self Care | Payer: Medicare Other | Source: Ambulatory Visit | Attending: Internal Medicine | Admitting: Internal Medicine

## 2020-01-04 ENCOUNTER — Other Ambulatory Visit (HOSPITAL_COMMUNITY): Payer: Self-pay | Admitting: Internal Medicine

## 2020-01-04 DIAGNOSIS — K7460 Unspecified cirrhosis of liver: Secondary | ICD-10-CM | POA: Diagnosis not present

## 2020-01-04 DIAGNOSIS — I7090 Unspecified atherosclerosis: Secondary | ICD-10-CM | POA: Diagnosis not present

## 2020-01-04 DIAGNOSIS — R10811 Right upper quadrant abdominal tenderness: Secondary | ICD-10-CM

## 2020-01-04 DIAGNOSIS — K838 Other specified diseases of biliary tract: Secondary | ICD-10-CM | POA: Diagnosis not present

## 2020-01-04 DIAGNOSIS — D3500 Benign neoplasm of unspecified adrenal gland: Secondary | ICD-10-CM | POA: Diagnosis not present

## 2020-01-04 DIAGNOSIS — N281 Cyst of kidney, acquired: Secondary | ICD-10-CM | POA: Diagnosis not present

## 2020-01-04 LAB — POCT I-STAT CREATININE: Creatinine, Ser: 0.7 mg/dL (ref 0.44–1.00)

## 2020-01-04 MED ORDER — IOHEXOL 9 MG/ML PO SOLN
ORAL | Status: AC
  Filled 2020-01-04: qty 1000

## 2020-01-04 MED ORDER — IOHEXOL 300 MG/ML  SOLN
100.0000 mL | Freq: Once | INTRAMUSCULAR | Status: AC | PRN
  Administered 2020-01-04: 100 mL via INTRAVENOUS

## 2020-01-18 ENCOUNTER — Other Ambulatory Visit: Payer: Self-pay

## 2020-01-18 ENCOUNTER — Encounter (INDEPENDENT_AMBULATORY_CARE_PROVIDER_SITE_OTHER): Payer: Self-pay | Admitting: Gastroenterology

## 2020-01-18 ENCOUNTER — Ambulatory Visit (INDEPENDENT_AMBULATORY_CARE_PROVIDER_SITE_OTHER): Payer: Medicare Other | Admitting: Gastroenterology

## 2020-01-18 VITALS — BP 154/95 | HR 81 | Temp 99.0°F | Ht 65.0 in | Wt 121.2 lb

## 2020-01-18 DIAGNOSIS — R933 Abnormal findings on diagnostic imaging of other parts of digestive tract: Secondary | ICD-10-CM

## 2020-01-18 DIAGNOSIS — R1011 Right upper quadrant pain: Secondary | ICD-10-CM

## 2020-01-18 MED ORDER — PANTOPRAZOLE SODIUM 40 MG PO TBEC: 40.0000 mg | DELAYED_RELEASE_TABLET | Freq: Every day | ORAL | 6 refills | Status: DC

## 2020-01-18 NOTE — Patient Instructions (Signed)
We are checking labs today for evaluation. Start protonix - this can help w/ gastritis or stomach ulcers

## 2020-01-18 NOTE — Progress Notes (Signed)
Patient profile: Sydney Mcdowell is a 69 y.o. female seen for evaluation of abnormal imaging.   History of Present Illness: Sydney Mcdowell is seen today for evaluation of abnormal CT scan.  She reports having right upper quadrant pain 1 to 2 years.  She reports chronic discomfort occurring 6 out of the 7 days of the week.  Describes epigastric and right upper quadrant pain radiating to her right mid back.  She reports often if she compresses her abdomen makes her feel better.  It does seem to worsen a about 30 minutes after eating.  Describes mainly as a pressure sensation without burning.  She denies any nausea vomiting.  She denies any classic GERD symptoms.  She denies significant early satiety.  She has very mild constipation, using laxative "smooth move" to keep bowels regular.  She denies any lower abdominal pain, melena, rectal bleeding.  She reports her last colonoscopy was approximately 20 years ago, she did a Cologuard approximately 2 years ago.  She takes aspirin 325 mg twice daily-occasional additional OTC NSAID use.    She reports a good appetite and denies recent weight loss.  Wt Readings from Last 3 Encounters:  01/18/20 121 lb 3.2 oz (55 kg)  07/29/16 126 lb (57.2 kg)     Last Colonoscopy: Per patient 20 years ago. Last Endoscopy: None prior   Past Medical History:  Past Medical History:  Diagnosis Date  . Arthritis   . Fractures     Problem List: There are no problems to display for this patient.   Past Surgical History: Past Surgical History:  Procedure Laterality Date  . ABDOMINAL EXPLORATION SURGERY    . ABDOMINAL HYSTERECTOMY    . KNEE SURGERY      Allergies: Allergies  Allergen Reactions  . Sulfa Antibiotics       Home Medications:  Current Outpatient Medications:  .  Ascorbic Acid (VITAMIN C) 100 MG tablet, Take 100 mg by mouth daily. Once a day, Disp: , Rfl:  .  aspirin 325 MG tablet, Take 325 mg by mouth 2 (two) times daily., Disp: , Rfl:   .  Cholecalciferol (VITAMIN D3) 1.25 MG (50000 UT) CAPS, Take by mouth. Once a day, Disp: , Rfl:  .  Coenzyme Q10 (COQ-10) 10 MG CAPS, Take by mouth., Disp: , Rfl:  .  ibuprofen (ADVIL,MOTRIN) 800 MG tablet, TAKE 1 TABLET BY MOUTH EVERY 8 HOURS AS NEEDED, Disp: 90 tablet, Rfl: 1 .  lisinopril (ZESTRIL) 10 MG tablet, Take 10 mg by mouth daily., Disp: , Rfl:  .  Multiple Vitamins-Minerals (MULTIVITAMIN WITH MINERALS) tablet, Take 1 tablet by mouth daily. Once a day, Disp: , Rfl:  .  simvastatin (ZOCOR) 10 MG tablet, Take 10 mg by mouth daily., Disp: , Rfl:  .  vitamin B-12 (CYANOCOBALAMIN) 100 MCG tablet, Take 100 mcg by mouth daily. Once a day, Disp: , Rfl:  .  pantoprazole (PROTONIX) 40 MG tablet, Take 1 tablet (40 mg total) by mouth daily before breakfast., Disp: 30 tablet, Rfl: 6   Family History: family history includes Diabetes in her brother; Heart attack in her brother, mother, and sister.  Denies family history of colon polyps or colon cancer     Social History:   reports that she has been smoking. She has never used smokeless tobacco. She reports that she does not drink alcohol and does not use drugs.   Review of Systems: Constitutional: Denies weight loss/weight gain  Eyes: No changes in vision. ENT:  No oral lesions, sore throat.  GI: see HPI.  Heme/Lymph: No easy bruising.  CV: No chest pain.  GU: No hematuria.  Integumentary: No rashes.  Neuro: No headaches.  Psych: No depression/anxiety.  Endocrine: No heat/cold intolerance.  Allergic/Immunologic: No urticaria.  Resp: No cough, SOB.  Musculoskeletal: No joint swelling.    Physical Examination: BP (!) 154/95 (BP Location: Left Arm, Patient Position: Sitting, Cuff Size: Normal)   Pulse 81   Temp 99 F (37.2 C) (Oral)   Ht 5\' 5"  (1.651 m)   Wt 121 lb 3.2 oz (55 kg)   BMI 20.17 kg/m  Gen: NAD, alert and oriented x 4 HEENT: PEERLA, EOMI, Neck: supple, no JVD Chest: CTA bilaterally, no wheezes, crackles, or other  adventitious sounds CV: RRR, no m/g/c/r Abd: soft, minimal TTP RUQ, ND, +BS in all four quadrants; no HSM, guarding, ridigity, or rebound tenderness Ext: no edema, well perfused with 2+ pulses, Skin: no rash or lesions noted on observed skin Lymph: no noted LAD  Data Reviewed:   CT a/p 01/04/20 IMPRESSION: 1. No acute abdominal/pelvic findings, mass lesion or lymphadenopathy. 2. Common bile duct dilatation of uncertain etiology. No obvious common bile duct stones, ampullary lesion or pancreatic head mass. Recommend correlation with liver function studies. If these are abnormal patient may need MRCP or ERCP for further evaluation. The common bile duct measures 9 mm in the porta hepatis and 8 mm in the head of the 3. Benign left adrenal gland adenoma and small renal cysts.   Assessment/Plan: Sydney Mcdowell is a 69 y.o. female seen for eval of abd pain, abnormal CT.  1. RUQ pain/epigastric pain -he has CBD dilation on CT as above, LFTs were normal in June 2021 but will repeat today.  She describes some mild epigastric and right upper quadrant pressure that worsens after eating.  She is also using aspirin 325 twice daily - start Protonix& check LFTs.  Dr. Laural Golden to verify if patient needs MRCP or ERCP based on CT results/LFTS.  Would also consider HIDA if symptoms persist  2. Colon cancer screening-she prefers Cologuard & reports she is up-to-date on this.  She denies any history of colon polyps colon cancer. No lower GI symptoms  Sydney Mcdowell was seen today for new patient (initial visit).  Diagnoses and all orders for this visit:  Abnormal CT scan, gastrointestinal tract -     Hepatic function panel -     Lipase  RUQ pain -     Hepatic function panel -     Lipase  Other orders -     pantoprazole (PROTONIX) 40 MG tablet; Take 1 tablet (40 mg total) by mouth daily before breakfast.    We will contact patient with results.  I personally performed the service, non-incident to.  (WP)  Laurine Blazer, [facility]

## 2020-01-19 LAB — HEPATIC FUNCTION PANEL
AG Ratio: 1.9 (calc) (ref 1.0–2.5)
ALT: 7 U/L (ref 6–29)
AST: 16 U/L (ref 10–35)
Albumin: 4.7 g/dL (ref 3.6–5.1)
Alkaline phosphatase (APISO): 93 U/L (ref 37–153)
Bilirubin, Direct: 0.1 mg/dL (ref 0.0–0.2)
Globulin: 2.5 g/dL (calc) (ref 1.9–3.7)
Indirect Bilirubin: 0.3 mg/dL (calc) (ref 0.2–1.2)
Total Bilirubin: 0.4 mg/dL (ref 0.2–1.2)
Total Protein: 7.2 g/dL (ref 6.1–8.1)

## 2020-01-19 LAB — LIPASE: Lipase: 20 U/L (ref 7–60)

## 2020-01-24 ENCOUNTER — Telehealth (INDEPENDENT_AMBULATORY_CARE_PROVIDER_SITE_OTHER): Payer: Self-pay | Admitting: Gastroenterology

## 2020-01-24 DIAGNOSIS — R933 Abnormal findings on diagnostic imaging of other parts of digestive tract: Secondary | ICD-10-CM

## 2020-01-24 NOTE — Telephone Encounter (Signed)
I called patient with results-we will proceed with MRCP.  Case discussed with Dr. Laural Golden.     Ann-I put an order in the chart and patient is aware you will call to schedule MRCP.

## 2020-01-25 NOTE — Telephone Encounter (Signed)
MRI sch'd 02/02/20 at 8 am (730, npo after midnight), patient aware

## 2020-02-02 ENCOUNTER — Other Ambulatory Visit (INDEPENDENT_AMBULATORY_CARE_PROVIDER_SITE_OTHER): Payer: Self-pay | Admitting: Gastroenterology

## 2020-02-02 ENCOUNTER — Ambulatory Visit (HOSPITAL_COMMUNITY)
Admission: RE | Admit: 2020-02-02 | Discharge: 2020-02-02 | Disposition: A | Discharge status: Home / Self Care | Payer: Medicare Other | Source: Ambulatory Visit | Attending: Gastroenterology | Admitting: Gastroenterology

## 2020-02-02 ENCOUNTER — Other Ambulatory Visit: Payer: Self-pay

## 2020-02-02 DIAGNOSIS — K838 Other specified diseases of biliary tract: Secondary | ICD-10-CM | POA: Diagnosis not present

## 2020-02-02 DIAGNOSIS — R933 Abnormal findings on diagnostic imaging of other parts of digestive tract: Secondary | ICD-10-CM

## 2020-02-02 MED ORDER — GADOBUTROL 1 MMOL/ML IV SOLN
5.0000 mL | Freq: Once | INTRAVENOUS | Status: AC | PRN
  Administered 2020-02-02: 5 mL via INTRAVENOUS

## 2020-02-08 ENCOUNTER — Telehealth (INDEPENDENT_AMBULATORY_CARE_PROVIDER_SITE_OTHER): Payer: Self-pay | Admitting: Gastroenterology

## 2020-02-08 NOTE — Telephone Encounter (Signed)
Patient left message regarding test results from 02-02-20  -  Please advise - ph# [phone]

## 2020-02-09 NOTE — Telephone Encounter (Signed)
I attempted to call patient x2.  Her voicemail box is full.  I was unable to leave a message given the full voicemail.

## 2020-02-10 NOTE — Telephone Encounter (Signed)
Addendum - I discussed w/ her. See result note for details.

## 2020-02-22 ENCOUNTER — Telehealth (INDEPENDENT_AMBULATORY_CARE_PROVIDER_SITE_OTHER): Payer: Self-pay | Admitting: Internal Medicine

## 2020-02-22 NOTE — Telephone Encounter (Signed)
I reviewed imaging studies with Dr. Thornton Papas.  CBD is not dilated on MRCP.  It is at upper limit of normal.  It tapers nicely and there are no filling defects or stricture. Hepatic duct is diffusely dilated measuring about 4 mm.  However there are no strictures or filling defects.  I was contacted with the results and my impression. He tells me the pain is worse after meals but is never unbearable and pushing under the right rib cage seem to ease the pain as does putting a small pillow behind her back.  Pain is worse when she is sitting. Therefore I am not even convinced that this pain is of GI origin but we have abnormal findings on imaging studies.  I asked patient to take pantoprazole daily.  She decided not to take the medication after reading side effects.  Will arrange for office visit in 4 to 5 weeks with me. Will need to have comprehensive chemistry panel 1 or 2 days prior to that visit.

## 2020-02-23 ENCOUNTER — Other Ambulatory Visit (INDEPENDENT_AMBULATORY_CARE_PROVIDER_SITE_OTHER): Payer: Self-pay | Admitting: *Deleted

## 2020-02-23 DIAGNOSIS — R933 Abnormal findings on diagnostic imaging of other parts of digestive tract: Secondary | ICD-10-CM

## 2020-02-23 DIAGNOSIS — R1011 Right upper quadrant pain: Secondary | ICD-10-CM

## 2020-02-23 NOTE — Telephone Encounter (Signed)
Patient has been given an appointment for November 16,2021 @ 11:00 am. We ask that  she arrive at 10:45 am.

## 2020-02-23 NOTE — Telephone Encounter (Signed)
Patient will be sent a lab reminder to have her lab done 03/30/2020.

## 2020-02-23 NOTE — Progress Notes (Signed)
Comprehensive lab ordered.

## 2020-03-07 ENCOUNTER — Other Ambulatory Visit (INDEPENDENT_AMBULATORY_CARE_PROVIDER_SITE_OTHER): Payer: Self-pay | Admitting: *Deleted

## 2020-03-07 DIAGNOSIS — R1011 Right upper quadrant pain: Secondary | ICD-10-CM

## 2020-03-07 DIAGNOSIS — R933 Abnormal findings on diagnostic imaging of other parts of digestive tract: Secondary | ICD-10-CM

## 2020-03-30 DIAGNOSIS — R1011 Right upper quadrant pain: Secondary | ICD-10-CM | POA: Diagnosis not present

## 2020-03-30 DIAGNOSIS — R933 Abnormal findings on diagnostic imaging of other parts of digestive tract: Secondary | ICD-10-CM | POA: Diagnosis not present

## 2020-03-31 LAB — COMPREHENSIVE METABOLIC PANEL
AG Ratio: 1.9 (calc) (ref 1.0–2.5)
ALT: 8 U/L (ref 6–29)
AST: 18 U/L (ref 10–35)
Albumin: 4.6 g/dL (ref 3.6–5.1)
Alkaline phosphatase (APISO): 88 U/L (ref 37–153)
BUN: 15 mg/dL (ref 7–25)
CO2: 28 mmol/L (ref 20–32)
Calcium: 10.1 mg/dL (ref 8.6–10.4)
Chloride: 104 mmol/L (ref 98–110)
Creat: 0.66 mg/dL (ref 0.60–0.93)
Globulin: 2.4 g/dL (calc) (ref 1.9–3.7)
Glucose, Bld: 96 mg/dL (ref 65–139)
Potassium: 4.4 mmol/L (ref 3.5–5.3)
Sodium: 138 mmol/L (ref 135–146)
Total Bilirubin: 0.6 mg/dL (ref 0.2–1.2)
Total Protein: 7 g/dL (ref 6.1–8.1)

## 2020-04-03 ENCOUNTER — Encounter (INDEPENDENT_AMBULATORY_CARE_PROVIDER_SITE_OTHER): Payer: Self-pay | Admitting: Internal Medicine

## 2020-04-03 ENCOUNTER — Other Ambulatory Visit: Payer: Self-pay

## 2020-04-03 ENCOUNTER — Ambulatory Visit (INDEPENDENT_AMBULATORY_CARE_PROVIDER_SITE_OTHER): Payer: Medicare Other | Admitting: Internal Medicine

## 2020-04-03 DIAGNOSIS — K838 Other specified diseases of biliary tract: Secondary | ICD-10-CM | POA: Insufficient documentation

## 2020-04-03 DIAGNOSIS — R1011 Right upper quadrant pain: Secondary | ICD-10-CM | POA: Insufficient documentation

## 2020-04-03 NOTE — Patient Instructions (Addendum)
Please call with progress report in 4 to 6 weeks or earlier if you have an episode of pain with meal.

## 2020-04-03 NOTE — Progress Notes (Signed)
Presenting complaint;  Right upper quadrant abdominal pain and dilated bile duct.  Database and subjective:  Patient is 69 year old Caucasian female who was seen in our office on 01/18/2020 for chronic epigastric and right upper quadrant abdominal pain and dilated bile duct as well as pancreatic duct on CT of 01/04/2020.  Common hepatic duct measured 9 mm in CBD measured 8 mm.  Her LFTs have remained normal. She was seen by Ms. Laurine Blazer, PA-C who recommended pantoprazole and blood work.  LFTs and lipase were normal. She was further evaluated with MRCP on 02/02/2020.  The study revealed common bile duct at upper limit of normal i.e. 7 mm.  There was no evidence of choledocholithiasis, cholelithiasis or pancreatic head mass.  Pancreatic duct was felt to be prominent measuring 4 mm but there were no stricture or stones.  She also had left Admiral adenoma in benign renal cysts.  Patient says she feels about the same.  She did not notice any improvement in her pain with PPI for a few weeks.  She therefore stopped it.  She says she has had this pain off and on for 3 years.  She has some degree of pain virtually every day.  When she has spells pain can continue for 1 week.  She has not been able to pinpoint any triggers.  She has noted pain is worse when she is sitting in she does better when she stands up or lies flat.  Pain radiates into the infrascapular area on the right side but does not reach the midline.  No history of shingles in the past.  Her appetite is very good.  She eats 2 meals and third meal is more or less a snack.  She has not lost any weight since her last visit to our office. She still wonders if her pain is due to gallbladder disease.  Family history is positive for gallbladder disease.  Her mother and sister both had her gallbladder removed. She had LFTs last week as planned.   Current Medications: Outpatient Encounter Medications as of 04/03/2020  Medication Sig  . Ascorbic Acid  (VITAMIN C) 100 MG tablet Take 100 mg by mouth daily. Once a day  . aspirin 325 MG tablet Take 325 mg by mouth 2 (two) times daily.  . Coenzyme Q10 (COQ-10) 100 MG CAPS Take 100 mg by mouth daily.   Marland Kitchen ibuprofen (ADVIL,MOTRIN) 800 MG tablet TAKE 1 TABLET BY MOUTH EVERY 8 HOURS AS NEEDED  . lisinopril (ZESTRIL) 10 MG tablet Take 10 mg by mouth daily.  . Multiple Vitamins-Minerals (EMERGEN-C VITAMIN C) PACK Take by mouth. Patient reports that she is currently taking twice a week.  . Multiple Vitamins-Minerals (MULTIVITAMIN WITH MINERALS) tablet Take 1 tablet by mouth daily. Once a day  . Probiotic Product (PROBIOTIC PO) Take by mouth daily.  . simvastatin (ZOCOR) 10 MG tablet Take 10 mg by mouth at bedtime.   Marland Kitchen VITAMIN D PO Take 1,000 Units by mouth.  . pantoprazole (PROTONIX) 40 MG tablet Take 1 tablet (40 mg total) by mouth daily before breakfast. (Patient not taking: Reported on 04/03/2020)  . [DISCONTINUED] Cholecalciferol (VITAMIN D3) 1.25 MG (50000 UT) CAPS Take by mouth. Once a day (Patient not taking: Reported on 04/03/2020)  . [DISCONTINUED] vitamin B-12 (CYANOCOBALAMIN) 100 MCG tablet Take 100 mcg by mouth daily. Once a day (Patient not taking: Reported on 04/03/2020)   No facility-administered encounter medications on file as of 04/03/2020.     Objective: Blood pressure Marland Kitchen)  145/79, pulse 75, temperature 98.4 F (36.9 C), temperature source Oral, height 5\' 5"  (1.651 m), weight 121 lb 12.8 oz (55.2 kg). Patient is alert and in no acute distress. Patient is wearing a mask. Conjunctiva is pink. Sclera is nonicteric Oropharyngeal mucosa is normal. She has upper dentures in place. No neck masses or thyromegaly noted. Cardiac exam with regular rhythm normal S1 and S2. No murmur or gallop noted. Lungs are clear to auscultation. Abdomen is symmetrical soft and nontender with organomegaly or masses. No LE edema or clubbing noted.  Labs/studies Results:  No flowsheet data found.  CMP  Latest Ref Rng & Units 03/30/2020 01/18/2020 01/04/2020  Glucose 65 - 139 mg/dL 96 - -  BUN 7 - 25 mg/dL 15 - -  Creatinine 0.60 - 0.93 mg/dL 0.66 - 0.70  Sodium 135 - 146 mmol/L 138 - -  Potassium 3.5 - 5.3 mmol/L 4.4 - -  Chloride 98 - 110 mmol/L 104 - -  CO2 20 - 32 mmol/L 28 - -  Calcium 8.6 - 10.4 mg/dL 10.1 - -  Total Protein 6.1 - 8.1 g/dL 7.0 7.2 -  Total Bilirubin 0.2 - 1.2 mg/dL 0.6 0.4 -  AST 10 - 35 U/L 18 16 -  ALT 6 - 29 U/L 8 7 -    Hepatic Function Latest Ref Rng & Units 03/30/2020 01/18/2020  Total Protein 6.1 - 8.1 g/dL 7.0 7.2  AST 10 - 35 U/L 18 16  ALT 6 - 29 U/L 8 7  Total Bilirubin 0.2 - 1.2 mg/dL 0.6 0.4  Bilirubin, Direct 0.0 - 0.2 mg/dL - 0.1      Assessment:  #1.  History of dilated bile duct.  Bile duct was mildly dilated on CT about 3 months ago but not dilated on MRCP which measured maximal diameter of 7 mm.  Fluctuation in duct size raises the possibility of microlithiasis but she does not have typical symptoms and never heavy documented bump in transaminases.  Therefore will continue to monitor biliary system.  #2.  Chronic right upper quadrant abdominal pain.  Pain is never triggered with meals.  There is positional variation.  She gets relieved when she stands up or lies flat.  I suspect that this is neuropathic pain. She will continue to monitor her pain and notify if there is change in the pattern or intensity.  #3.  Mildly dilated pancreatic duct.  Both CT and MRI did not reveal any abnormality to pancreas.  Therefore this this finding is felt to be clinically insignificant.  Plan:  Medication list updated. Patient advised to keep symptom diary for the next 4 to 6 weeks and call with progress report.  She will pay attention to location duration of pain relationship to meals and if she has any associated symptoms. She will return for office visit in 6 months at which time we will consider LFTs and abdominal ultrasound.

## 2020-04-17 DIAGNOSIS — R7303 Prediabetes: Secondary | ICD-10-CM | POA: Diagnosis not present

## 2020-04-17 DIAGNOSIS — E559 Vitamin D deficiency, unspecified: Secondary | ICD-10-CM | POA: Diagnosis not present

## 2020-04-17 DIAGNOSIS — E782 Mixed hyperlipidemia: Secondary | ICD-10-CM | POA: Diagnosis not present

## 2020-04-17 DIAGNOSIS — M5481 Occipital neuralgia: Secondary | ICD-10-CM | POA: Diagnosis not present

## 2020-04-17 DIAGNOSIS — I10 Essential (primary) hypertension: Secondary | ICD-10-CM | POA: Diagnosis not present

## 2020-04-17 DIAGNOSIS — Z681 Body mass index (BMI) 19 or less, adult: Secondary | ICD-10-CM | POA: Diagnosis not present

## 2020-04-17 DIAGNOSIS — Z0001 Encounter for general adult medical examination with abnormal findings: Secondary | ICD-10-CM | POA: Diagnosis not present

## 2020-04-20 DIAGNOSIS — F17210 Nicotine dependence, cigarettes, uncomplicated: Secondary | ICD-10-CM | POA: Diagnosis not present

## 2020-04-20 DIAGNOSIS — E559 Vitamin D deficiency, unspecified: Secondary | ICD-10-CM | POA: Diagnosis not present

## 2020-04-20 DIAGNOSIS — Z23 Encounter for immunization: Secondary | ICD-10-CM | POA: Diagnosis not present

## 2020-04-20 DIAGNOSIS — I10 Essential (primary) hypertension: Secondary | ICD-10-CM | POA: Diagnosis not present

## 2020-04-20 DIAGNOSIS — E782 Mixed hyperlipidemia: Secondary | ICD-10-CM | POA: Diagnosis not present

## 2020-10-02 ENCOUNTER — Ambulatory Visit (INDEPENDENT_AMBULATORY_CARE_PROVIDER_SITE_OTHER): Payer: Medicare Other | Admitting: Internal Medicine

## 2020-10-02 ENCOUNTER — Encounter (INDEPENDENT_AMBULATORY_CARE_PROVIDER_SITE_OTHER): Payer: Self-pay | Admitting: Internal Medicine

## 2020-10-23 DIAGNOSIS — I10 Essential (primary) hypertension: Secondary | ICD-10-CM | POA: Diagnosis not present

## 2020-10-23 DIAGNOSIS — E559 Vitamin D deficiency, unspecified: Secondary | ICD-10-CM | POA: Diagnosis not present

## 2020-10-23 DIAGNOSIS — R7303 Prediabetes: Secondary | ICD-10-CM | POA: Diagnosis not present

## 2020-10-26 DIAGNOSIS — E782 Mixed hyperlipidemia: Secondary | ICD-10-CM | POA: Diagnosis not present

## 2020-10-26 DIAGNOSIS — J329 Chronic sinusitis, unspecified: Secondary | ICD-10-CM | POA: Diagnosis not present

## 2020-10-26 DIAGNOSIS — E875 Hyperkalemia: Secondary | ICD-10-CM | POA: Diagnosis not present

## 2020-10-26 DIAGNOSIS — R109 Unspecified abdominal pain: Secondary | ICD-10-CM | POA: Diagnosis not present

## 2020-10-26 DIAGNOSIS — E559 Vitamin D deficiency, unspecified: Secondary | ICD-10-CM | POA: Diagnosis not present

## 2020-10-26 DIAGNOSIS — F17200 Nicotine dependence, unspecified, uncomplicated: Secondary | ICD-10-CM | POA: Diagnosis not present

## 2020-10-26 DIAGNOSIS — I10 Essential (primary) hypertension: Secondary | ICD-10-CM | POA: Diagnosis not present

## 2020-10-26 DIAGNOSIS — R7303 Prediabetes: Secondary | ICD-10-CM | POA: Diagnosis not present

## 2020-10-26 DIAGNOSIS — H9201 Otalgia, right ear: Secondary | ICD-10-CM | POA: Diagnosis not present

## 2021-03-21 DIAGNOSIS — Z23 Encounter for immunization: Secondary | ICD-10-CM | POA: Diagnosis not present

## 2021-04-29 DIAGNOSIS — I10 Essential (primary) hypertension: Secondary | ICD-10-CM | POA: Diagnosis not present

## 2021-04-29 DIAGNOSIS — R7303 Prediabetes: Secondary | ICD-10-CM | POA: Diagnosis not present

## 2021-05-02 DIAGNOSIS — E875 Hyperkalemia: Secondary | ICD-10-CM | POA: Diagnosis not present

## 2021-05-02 DIAGNOSIS — Z0001 Encounter for general adult medical examination with abnormal findings: Secondary | ICD-10-CM | POA: Diagnosis not present

## 2021-05-02 DIAGNOSIS — J329 Chronic sinusitis, unspecified: Secondary | ICD-10-CM | POA: Diagnosis not present

## 2021-05-02 DIAGNOSIS — I10 Essential (primary) hypertension: Secondary | ICD-10-CM | POA: Diagnosis not present

## 2021-05-02 DIAGNOSIS — M25532 Pain in left wrist: Secondary | ICD-10-CM | POA: Diagnosis not present

## 2021-05-02 DIAGNOSIS — E559 Vitamin D deficiency, unspecified: Secondary | ICD-10-CM | POA: Diagnosis not present

## 2021-05-02 DIAGNOSIS — R109 Unspecified abdominal pain: Secondary | ICD-10-CM | POA: Diagnosis not present

## 2021-05-02 DIAGNOSIS — E782 Mixed hyperlipidemia: Secondary | ICD-10-CM | POA: Diagnosis not present

## 2021-05-02 DIAGNOSIS — R7303 Prediabetes: Secondary | ICD-10-CM | POA: Diagnosis not present

## 2021-05-02 DIAGNOSIS — Z87891 Personal history of nicotine dependence: Secondary | ICD-10-CM | POA: Diagnosis not present

## 2021-06-04 DIAGNOSIS — J069 Acute upper respiratory infection, unspecified: Secondary | ICD-10-CM | POA: Diagnosis not present

## 2021-06-04 DIAGNOSIS — R0981 Nasal congestion: Secondary | ICD-10-CM | POA: Diagnosis not present

## 2021-06-04 DIAGNOSIS — J029 Acute pharyngitis, unspecified: Secondary | ICD-10-CM | POA: Diagnosis not present

## 2021-06-04 DIAGNOSIS — R059 Cough, unspecified: Secondary | ICD-10-CM | POA: Diagnosis not present

## 2021-06-18 DIAGNOSIS — R079 Chest pain, unspecified: Secondary | ICD-10-CM | POA: Diagnosis not present

## 2021-06-18 DIAGNOSIS — R000 Tachycardia, unspecified: Secondary | ICD-10-CM | POA: Diagnosis not present

## 2021-06-18 DIAGNOSIS — R0981 Nasal congestion: Secondary | ICD-10-CM | POA: Diagnosis not present

## 2021-06-18 DIAGNOSIS — R059 Cough, unspecified: Secondary | ICD-10-CM | POA: Diagnosis not present

## 2021-06-18 DIAGNOSIS — J019 Acute sinusitis, unspecified: Secondary | ICD-10-CM | POA: Diagnosis not present

## 2021-07-08 DIAGNOSIS — R0981 Nasal congestion: Secondary | ICD-10-CM | POA: Diagnosis not present

## 2021-07-08 DIAGNOSIS — R519 Headache, unspecified: Secondary | ICD-10-CM | POA: Diagnosis not present

## 2021-07-08 DIAGNOSIS — J029 Acute pharyngitis, unspecified: Secondary | ICD-10-CM | POA: Diagnosis not present

## 2021-10-21 DIAGNOSIS — T148XXA Other injury of unspecified body region, initial encounter: Secondary | ICD-10-CM | POA: Diagnosis not present

## 2021-10-31 DIAGNOSIS — I10 Essential (primary) hypertension: Secondary | ICD-10-CM | POA: Diagnosis not present

## 2021-10-31 DIAGNOSIS — E559 Vitamin D deficiency, unspecified: Secondary | ICD-10-CM | POA: Diagnosis not present

## 2021-10-31 DIAGNOSIS — R7303 Prediabetes: Secondary | ICD-10-CM | POA: Diagnosis not present

## 2021-10-31 DIAGNOSIS — E782 Mixed hyperlipidemia: Secondary | ICD-10-CM | POA: Diagnosis not present

## 2021-11-11 DIAGNOSIS — E875 Hyperkalemia: Secondary | ICD-10-CM | POA: Diagnosis not present

## 2021-11-11 DIAGNOSIS — R7303 Prediabetes: Secondary | ICD-10-CM | POA: Diagnosis not present

## 2021-11-11 DIAGNOSIS — J329 Chronic sinusitis, unspecified: Secondary | ICD-10-CM | POA: Diagnosis not present

## 2021-11-11 DIAGNOSIS — Z87891 Personal history of nicotine dependence: Secondary | ICD-10-CM | POA: Diagnosis not present

## 2021-11-11 DIAGNOSIS — Z23 Encounter for immunization: Secondary | ICD-10-CM | POA: Diagnosis not present

## 2021-11-11 DIAGNOSIS — E559 Vitamin D deficiency, unspecified: Secondary | ICD-10-CM | POA: Diagnosis not present

## 2021-11-11 DIAGNOSIS — R109 Unspecified abdominal pain: Secondary | ICD-10-CM | POA: Diagnosis not present

## 2021-11-11 DIAGNOSIS — Z0001 Encounter for general adult medical examination with abnormal findings: Secondary | ICD-10-CM | POA: Diagnosis not present

## 2021-11-11 DIAGNOSIS — W19XXXA Unspecified fall, initial encounter: Secondary | ICD-10-CM | POA: Diagnosis not present

## 2021-11-11 DIAGNOSIS — R0981 Nasal congestion: Secondary | ICD-10-CM | POA: Diagnosis not present

## 2022-01-29 DIAGNOSIS — Z1211 Encounter for screening for malignant neoplasm of colon: Secondary | ICD-10-CM | POA: Diagnosis not present

## 2022-02-04 LAB — COLOGUARD: COLOGUARD: NEGATIVE

## 2022-02-04 LAB — EXTERNAL GENERIC LAB PROCEDURE: COLOGUARD: NEGATIVE

## 2022-05-08 DIAGNOSIS — R7303 Prediabetes: Secondary | ICD-10-CM | POA: Diagnosis not present

## 2022-05-08 DIAGNOSIS — J069 Acute upper respiratory infection, unspecified: Secondary | ICD-10-CM | POA: Diagnosis not present

## 2022-05-08 DIAGNOSIS — E782 Mixed hyperlipidemia: Secondary | ICD-10-CM | POA: Diagnosis not present

## 2022-05-08 DIAGNOSIS — Z87891 Personal history of nicotine dependence: Secondary | ICD-10-CM | POA: Diagnosis not present

## 2022-05-14 ENCOUNTER — Other Ambulatory Visit (HOSPITAL_COMMUNITY): Payer: Self-pay | Admitting: Family Medicine

## 2022-05-14 ENCOUNTER — Other Ambulatory Visit: Payer: Self-pay

## 2022-05-14 DIAGNOSIS — Z1231 Encounter for screening mammogram for malignant neoplasm of breast: Secondary | ICD-10-CM

## 2022-05-14 DIAGNOSIS — W19XXXA Unspecified fall, initial encounter: Secondary | ICD-10-CM | POA: Diagnosis not present

## 2022-05-14 DIAGNOSIS — E559 Vitamin D deficiency, unspecified: Secondary | ICD-10-CM | POA: Diagnosis not present

## 2022-05-14 DIAGNOSIS — R1011 Right upper quadrant pain: Secondary | ICD-10-CM | POA: Diagnosis not present

## 2022-05-14 DIAGNOSIS — S0990XA Unspecified injury of head, initial encounter: Secondary | ICD-10-CM | POA: Diagnosis not present

## 2022-05-14 DIAGNOSIS — R7303 Prediabetes: Secondary | ICD-10-CM | POA: Diagnosis not present

## 2022-05-14 DIAGNOSIS — I10 Essential (primary) hypertension: Secondary | ICD-10-CM | POA: Diagnosis not present

## 2022-05-14 DIAGNOSIS — Z0000 Encounter for general adult medical examination without abnormal findings: Secondary | ICD-10-CM | POA: Diagnosis not present

## 2022-05-14 DIAGNOSIS — J069 Acute upper respiratory infection, unspecified: Secondary | ICD-10-CM | POA: Diagnosis not present

## 2022-05-14 DIAGNOSIS — R0981 Nasal congestion: Secondary | ICD-10-CM | POA: Diagnosis not present

## 2022-05-14 DIAGNOSIS — Z87891 Personal history of nicotine dependence: Secondary | ICD-10-CM | POA: Diagnosis not present

## 2022-05-14 DIAGNOSIS — Z0001 Encounter for general adult medical examination with abnormal findings: Secondary | ICD-10-CM | POA: Diagnosis not present

## 2022-05-14 DIAGNOSIS — E782 Mixed hyperlipidemia: Secondary | ICD-10-CM | POA: Diagnosis not present

## 2022-08-05 DIAGNOSIS — R519 Headache, unspecified: Secondary | ICD-10-CM | POA: Diagnosis not present

## 2022-08-05 DIAGNOSIS — Z20822 Contact with and (suspected) exposure to covid-19: Secondary | ICD-10-CM | POA: Diagnosis not present

## 2022-08-05 DIAGNOSIS — R059 Cough, unspecified: Secondary | ICD-10-CM | POA: Diagnosis not present

## 2022-08-05 DIAGNOSIS — R509 Fever, unspecified: Secondary | ICD-10-CM | POA: Diagnosis not present

## 2022-08-05 DIAGNOSIS — U071 COVID-19: Secondary | ICD-10-CM | POA: Diagnosis not present

## 2022-08-16 DIAGNOSIS — R519 Headache, unspecified: Secondary | ICD-10-CM | POA: Diagnosis not present

## 2022-08-16 DIAGNOSIS — R059 Cough, unspecified: Secondary | ICD-10-CM | POA: Diagnosis not present

## 2022-08-16 DIAGNOSIS — U071 COVID-19: Secondary | ICD-10-CM | POA: Diagnosis not present

## 2022-08-16 DIAGNOSIS — Z20822 Contact with and (suspected) exposure to covid-19: Secondary | ICD-10-CM | POA: Diagnosis not present

## 2022-08-22 DIAGNOSIS — Z20822 Contact with and (suspected) exposure to covid-19: Secondary | ICD-10-CM | POA: Diagnosis not present

## 2022-08-22 DIAGNOSIS — R509 Fever, unspecified: Secondary | ICD-10-CM | POA: Diagnosis not present

## 2022-08-22 DIAGNOSIS — R059 Cough, unspecified: Secondary | ICD-10-CM | POA: Diagnosis not present

## 2022-08-22 DIAGNOSIS — U071 COVID-19: Secondary | ICD-10-CM | POA: Diagnosis not present

## 2022-08-22 DIAGNOSIS — R519 Headache, unspecified: Secondary | ICD-10-CM | POA: Diagnosis not present

## 2022-08-29 DIAGNOSIS — Z20822 Contact with and (suspected) exposure to covid-19: Secondary | ICD-10-CM | POA: Diagnosis not present

## 2022-08-29 DIAGNOSIS — R519 Headache, unspecified: Secondary | ICD-10-CM | POA: Diagnosis not present

## 2022-08-29 DIAGNOSIS — R059 Cough, unspecified: Secondary | ICD-10-CM | POA: Diagnosis not present

## 2022-08-29 DIAGNOSIS — R509 Fever, unspecified: Secondary | ICD-10-CM | POA: Diagnosis not present

## 2022-08-29 DIAGNOSIS — U071 COVID-19: Secondary | ICD-10-CM | POA: Diagnosis not present

## 2022-11-17 DIAGNOSIS — E782 Mixed hyperlipidemia: Secondary | ICD-10-CM | POA: Diagnosis not present

## 2022-11-17 DIAGNOSIS — R7303 Prediabetes: Secondary | ICD-10-CM | POA: Diagnosis not present

## 2022-11-25 ENCOUNTER — Other Ambulatory Visit (HOSPITAL_COMMUNITY): Payer: Self-pay | Admitting: Family Medicine

## 2022-11-25 DIAGNOSIS — E875 Hyperkalemia: Secondary | ICD-10-CM | POA: Diagnosis not present

## 2022-11-25 DIAGNOSIS — R0981 Nasal congestion: Secondary | ICD-10-CM | POA: Diagnosis not present

## 2022-11-25 DIAGNOSIS — E782 Mixed hyperlipidemia: Secondary | ICD-10-CM | POA: Diagnosis not present

## 2022-11-25 DIAGNOSIS — Z0001 Encounter for general adult medical examination with abnormal findings: Secondary | ICD-10-CM | POA: Diagnosis not present

## 2022-11-25 DIAGNOSIS — I10 Essential (primary) hypertension: Secondary | ICD-10-CM | POA: Diagnosis not present

## 2022-11-25 DIAGNOSIS — R1011 Right upper quadrant pain: Secondary | ICD-10-CM

## 2022-11-25 DIAGNOSIS — F17200 Nicotine dependence, unspecified, uncomplicated: Secondary | ICD-10-CM | POA: Diagnosis not present

## 2022-11-25 DIAGNOSIS — H6121 Impacted cerumen, right ear: Secondary | ICD-10-CM | POA: Diagnosis not present

## 2022-11-25 DIAGNOSIS — R7303 Prediabetes: Secondary | ICD-10-CM | POA: Diagnosis not present

## 2022-11-25 DIAGNOSIS — E559 Vitamin D deficiency, unspecified: Secondary | ICD-10-CM | POA: Diagnosis not present

## 2022-11-25 DIAGNOSIS — R109 Unspecified abdominal pain: Secondary | ICD-10-CM | POA: Diagnosis not present

## 2022-11-25 DIAGNOSIS — J329 Chronic sinusitis, unspecified: Secondary | ICD-10-CM | POA: Diagnosis not present

## 2022-12-05 ENCOUNTER — Ambulatory Visit (HOSPITAL_COMMUNITY)
Admission: RE | Admit: 2022-12-05 | Discharge: 2022-12-05 | Disposition: A | Discharge status: Home / Self Care | Payer: Medicare Other | Source: Ambulatory Visit | Attending: Family Medicine | Admitting: Family Medicine

## 2022-12-05 DIAGNOSIS — K838 Other specified diseases of biliary tract: Secondary | ICD-10-CM | POA: Diagnosis not present

## 2022-12-05 DIAGNOSIS — R1011 Right upper quadrant pain: Secondary | ICD-10-CM | POA: Diagnosis not present

## 2022-12-08 ENCOUNTER — Other Ambulatory Visit (HOSPITAL_COMMUNITY): Payer: Self-pay | Admitting: Family Medicine

## 2022-12-08 DIAGNOSIS — K835 Biliary cyst: Secondary | ICD-10-CM

## 2022-12-18 ENCOUNTER — Other Ambulatory Visit (HOSPITAL_COMMUNITY): Payer: Self-pay | Admitting: Family Medicine

## 2022-12-18 ENCOUNTER — Ambulatory Visit (HOSPITAL_COMMUNITY)
Admission: RE | Admit: 2022-12-18 | Discharge: 2022-12-18 | Disposition: A | Discharge status: Home / Self Care | Payer: Medicare Other | Source: Ambulatory Visit | Attending: Family Medicine | Admitting: Family Medicine

## 2022-12-18 DIAGNOSIS — D3502 Benign neoplasm of left adrenal gland: Secondary | ICD-10-CM | POA: Diagnosis not present

## 2022-12-18 DIAGNOSIS — K835 Biliary cyst: Secondary | ICD-10-CM | POA: Insufficient documentation

## 2022-12-18 DIAGNOSIS — R52 Pain, unspecified: Secondary | ICD-10-CM | POA: Diagnosis not present

## 2022-12-18 DIAGNOSIS — K8689 Other specified diseases of pancreas: Secondary | ICD-10-CM | POA: Diagnosis not present

## 2022-12-18 DIAGNOSIS — K7689 Other specified diseases of liver: Secondary | ICD-10-CM | POA: Diagnosis not present

## 2022-12-18 DIAGNOSIS — N281 Cyst of kidney, acquired: Secondary | ICD-10-CM | POA: Diagnosis not present

## 2023-03-22 DIAGNOSIS — L239 Allergic contact dermatitis, unspecified cause: Secondary | ICD-10-CM | POA: Diagnosis not present

## 2023-03-22 DIAGNOSIS — Z681 Body mass index (BMI) 19 or less, adult: Secondary | ICD-10-CM | POA: Diagnosis not present

## 2023-03-22 DIAGNOSIS — R030 Elevated blood-pressure reading, without diagnosis of hypertension: Secondary | ICD-10-CM | POA: Diagnosis not present

## 2023-05-21 DIAGNOSIS — H5213 Myopia, bilateral: Secondary | ICD-10-CM | POA: Diagnosis not present

## 2023-05-21 DIAGNOSIS — H40053 Ocular hypertension, bilateral: Secondary | ICD-10-CM | POA: Diagnosis not present

## 2023-05-28 DIAGNOSIS — R7303 Prediabetes: Secondary | ICD-10-CM | POA: Diagnosis not present

## 2023-05-28 DIAGNOSIS — E782 Mixed hyperlipidemia: Secondary | ICD-10-CM | POA: Diagnosis not present

## 2023-06-03 ENCOUNTER — Other Ambulatory Visit (HOSPITAL_COMMUNITY): Payer: Self-pay | Admitting: Nurse Practitioner

## 2023-06-03 DIAGNOSIS — E782 Mixed hyperlipidemia: Secondary | ICD-10-CM | POA: Diagnosis not present

## 2023-06-03 DIAGNOSIS — R109 Unspecified abdominal pain: Secondary | ICD-10-CM | POA: Diagnosis not present

## 2023-06-03 DIAGNOSIS — I10 Essential (primary) hypertension: Secondary | ICD-10-CM | POA: Diagnosis not present

## 2023-06-03 DIAGNOSIS — R7303 Prediabetes: Secondary | ICD-10-CM | POA: Diagnosis not present

## 2023-06-03 DIAGNOSIS — E875 Hyperkalemia: Secondary | ICD-10-CM | POA: Diagnosis not present

## 2023-06-03 DIAGNOSIS — M542 Cervicalgia: Secondary | ICD-10-CM | POA: Diagnosis not present

## 2023-06-03 DIAGNOSIS — R0981 Nasal congestion: Secondary | ICD-10-CM | POA: Diagnosis not present

## 2023-06-03 DIAGNOSIS — F17200 Nicotine dependence, unspecified, uncomplicated: Secondary | ICD-10-CM | POA: Diagnosis not present

## 2023-06-03 DIAGNOSIS — H269 Unspecified cataract: Secondary | ICD-10-CM | POA: Diagnosis not present

## 2023-06-03 DIAGNOSIS — Z13820 Encounter for screening for osteoporosis: Secondary | ICD-10-CM

## 2023-06-03 DIAGNOSIS — Z122 Encounter for screening for malignant neoplasm of respiratory organs: Secondary | ICD-10-CM

## 2023-06-03 DIAGNOSIS — J329 Chronic sinusitis, unspecified: Secondary | ICD-10-CM | POA: Diagnosis not present

## 2023-06-03 DIAGNOSIS — R1011 Right upper quadrant pain: Secondary | ICD-10-CM | POA: Diagnosis not present

## 2023-06-03 DIAGNOSIS — E559 Vitamin D deficiency, unspecified: Secondary | ICD-10-CM | POA: Diagnosis not present

## 2023-06-03 DIAGNOSIS — Z1231 Encounter for screening mammogram for malignant neoplasm of breast: Secondary | ICD-10-CM

## 2023-06-05 ENCOUNTER — Other Ambulatory Visit (HOSPITAL_COMMUNITY): Payer: Medicare Other

## 2023-06-05 ENCOUNTER — Ambulatory Visit (HOSPITAL_COMMUNITY): Payer: Medicare Other

## 2023-06-12 ENCOUNTER — Encounter (INDEPENDENT_AMBULATORY_CARE_PROVIDER_SITE_OTHER): Payer: Self-pay | Admitting: *Deleted

## 2023-07-06 ENCOUNTER — Telehealth (INDEPENDENT_AMBULATORY_CARE_PROVIDER_SITE_OTHER): Payer: Self-pay | Admitting: Gastroenterology

## 2023-07-06 NOTE — Telephone Encounter (Signed)
Who is your primary care physician:   Reasons for the colonoscopy:   Have you had a colonoscopy before?  yes  Do you have family history of colon cancer? no  Previous colonoscopy with polyps removed? No not I know of  Do you have a history colorectal cancer?   no  Are you diabetic? If yes, Type 1 or Type 2?    no  Do you have a prosthetic or mechanical heart valve? no  Do you have a pacemaker/defibrillator?   no  Have you had endocarditis/atrial fibrillation? no  Have you had joint replacement within the last 12 months?  no  Do you tend to be constipated or have to use laxatives? Yes at times  Do you have any history of drugs or alchohol?  no  Do you use supplemental oxygen?  no  Have you had a stroke or heart attack within the last 6 months? no  Do you take weight loss medication?  no  For female patients: have you had a hysterectomy?  yes                                     are you post menopausal?       no                                            do you still have your menstrual cycle? no      Do you take any blood-thinning medications such as: (aspirin, warfarin, Plavix, Aggrenox)    If yes we need the name, milligram, dosage and who is prescribing doctor Aspirin 325 mg Current Outpatient Medications on File Prior to Visit  Medication Sig Dispense Refill   aspirin 325 MG tablet Take 325 mg by mouth daily.      lisinopril (ZESTRIL) 10 MG tablet Take 10 mg by mouth daily.     simvastatin (ZOCOR) 10 MG tablet Take 10 mg by mouth at bedtime.      Ascorbic Acid (VITAMIN C) 100 MG tablet Take 100 mg by mouth daily. Once a day (Patient not taking: Reported on 07/06/2023)     Coenzyme Q10 (COQ-10) 100 MG CAPS Take 100 mg by mouth daily.  (Patient not taking: Reported on 07/06/2023)     ibuprofen (ADVIL,MOTRIN) 800 MG tablet TAKE 1 TABLET BY MOUTH EVERY 8 HOURS AS NEEDED (Patient not taking: Reported on 07/06/2023) 90 tablet 1   Multiple Vitamins-Minerals (EMERGEN-C VITAMIN C)  PACK Take by mouth. Patient reports that she is currently taking twice a week. (Patient not taking: Reported on 07/06/2023)     Multiple Vitamins-Minerals (MULTIVITAMIN WITH MINERALS) tablet Take 1 tablet by mouth daily. Once a day (Patient not taking: Reported on 07/06/2023)     Probiotic Product (PROBIOTIC PO) Take by mouth daily. (Patient not taking: Reported on 07/06/2023)     VITAMIN D PO Take 1,000 Units by mouth. (Patient not taking: Reported on 07/06/2023)     No current facility-administered medications on file prior to visit.    Allergies  Allergen Reactions   Sulfa Antibiotics      Pharmacy: [facility]  Primary Insurance Name: [employer]  Best number where you can be reached: [phone]

## 2023-07-10 NOTE — Telephone Encounter (Signed)
Pt contacted. Pt would like to have TCS done in April due to lots of things happening. Will call pt with April schedule. Pt would like early appt such as 7:30am. Fridays are not good. (Not April 7)

## 2023-07-24 NOTE — Telephone Encounter (Signed)
 Pt contacted. Pt will call back after 3/19 eye dr appt for her cataracts. Pt would like April early morning.

## 2023-08-05 DIAGNOSIS — H524 Presbyopia: Secondary | ICD-10-CM | POA: Diagnosis not present

## 2023-08-05 DIAGNOSIS — H40012 Open angle with borderline findings, low risk, left eye: Secondary | ICD-10-CM | POA: Diagnosis not present

## 2023-08-05 DIAGNOSIS — D23111 Other benign neoplasm of skin of right upper eyelid, including canthus: Secondary | ICD-10-CM | POA: Diagnosis not present

## 2023-08-05 DIAGNOSIS — H52213 Irregular astigmatism, bilateral: Secondary | ICD-10-CM | POA: Diagnosis not present

## 2023-08-05 DIAGNOSIS — H25813 Combined forms of age-related cataract, bilateral: Secondary | ICD-10-CM | POA: Diagnosis not present

## 2023-08-05 DIAGNOSIS — H02834 Dermatochalasis of left upper eyelid: Secondary | ICD-10-CM | POA: Diagnosis not present

## 2023-08-05 DIAGNOSIS — H02831 Dermatochalasis of right upper eyelid: Secondary | ICD-10-CM | POA: Diagnosis not present

## 2023-10-01 DIAGNOSIS — H40012 Open angle with borderline findings, low risk, left eye: Secondary | ICD-10-CM | POA: Diagnosis not present

## 2023-10-01 DIAGNOSIS — H25813 Combined forms of age-related cataract, bilateral: Secondary | ICD-10-CM | POA: Diagnosis not present

## 2023-10-20 DIAGNOSIS — H2512 Age-related nuclear cataract, left eye: Secondary | ICD-10-CM | POA: Diagnosis not present

## 2023-10-20 DIAGNOSIS — H25813 Combined forms of age-related cataract, bilateral: Secondary | ICD-10-CM | POA: Diagnosis not present

## 2023-10-20 DIAGNOSIS — H268 Other specified cataract: Secondary | ICD-10-CM | POA: Diagnosis not present

## 2023-11-04 ENCOUNTER — Encounter (HOSPITAL_COMMUNITY): Payer: Self-pay

## 2023-11-04 ENCOUNTER — Ambulatory Visit (HOSPITAL_COMMUNITY): Payer: Medicare Other

## 2023-11-25 DIAGNOSIS — E559 Vitamin D deficiency, unspecified: Secondary | ICD-10-CM | POA: Diagnosis not present

## 2023-11-25 DIAGNOSIS — E782 Mixed hyperlipidemia: Secondary | ICD-10-CM | POA: Diagnosis not present

## 2023-11-25 DIAGNOSIS — R7303 Prediabetes: Secondary | ICD-10-CM | POA: Diagnosis not present

## 2024-03-23 ENCOUNTER — Encounter (INDEPENDENT_AMBULATORY_CARE_PROVIDER_SITE_OTHER): Payer: Self-pay | Admitting: Gastroenterology

## 2024-05-03 ENCOUNTER — Encounter (INDEPENDENT_AMBULATORY_CARE_PROVIDER_SITE_OTHER): Payer: Self-pay | Admitting: *Deleted
